# Patient Record
Sex: Male | Born: 1985 | Race: Black or African American | Hispanic: No | Marital: Single | Smoking: Current every day smoker
Health system: Southern US, Community
[De-identification: ages and names within clinical notes are randomized; demographics above are authoritative.]

## PROBLEM LIST (undated history)

## (undated) DIAGNOSIS — K2970 Gastritis, unspecified, without bleeding: Secondary | ICD-10-CM

---
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
## 2004-12-02 ENCOUNTER — Emergency Department (HOSPITAL_COMMUNITY): Admission: EM | Admit: 2004-12-02 | Discharge: 2004-12-03 | Payer: Self-pay | Admitting: Emergency Medicine

## 2005-07-06 ENCOUNTER — Emergency Department (HOSPITAL_COMMUNITY): Admission: EM | Admit: 2005-07-06 | Discharge: 2005-07-06 | Payer: Self-pay | Admitting: Emergency Medicine

## 2005-11-15 ENCOUNTER — Emergency Department (HOSPITAL_COMMUNITY): Admission: EM | Admit: 2005-11-15 | Discharge: 2005-11-15 | Payer: Self-pay | Admitting: Family Medicine

## 2006-04-16 ENCOUNTER — Emergency Department (HOSPITAL_COMMUNITY): Admission: EM | Admit: 2006-04-16 | Discharge: 2006-04-16 | Payer: Self-pay | Admitting: Family Medicine

## 2006-08-31 ENCOUNTER — Emergency Department (HOSPITAL_COMMUNITY): Admission: EM | Admit: 2006-08-31 | Discharge: 2006-09-01 | Payer: Self-pay | Admitting: Emergency Medicine

## 2006-09-02 ENCOUNTER — Emergency Department (HOSPITAL_COMMUNITY): Admission: EM | Admit: 2006-09-02 | Discharge: 2006-09-02 | Payer: Self-pay | Admitting: Family Medicine

## 2008-03-20 ENCOUNTER — Emergency Department (HOSPITAL_COMMUNITY): Admission: EM | Admit: 2008-03-20 | Discharge: 2008-03-20 | Payer: Self-pay | Admitting: Family Medicine

## 2009-09-17 ENCOUNTER — Emergency Department (HOSPITAL_COMMUNITY): Admission: EM | Admit: 2009-09-17 | Discharge: 2009-09-17 | Payer: Self-pay | Admitting: Family Medicine

## 2010-01-19 ENCOUNTER — Emergency Department (HOSPITAL_COMMUNITY): Admission: EM | Admit: 2010-01-19 | Discharge: 2010-01-19 | Payer: Self-pay | Admitting: Emergency Medicine

## 2010-08-21 LAB — COMPREHENSIVE METABOLIC PANEL
Alkaline Phosphatase: 67 U/L (ref 39–117)
CO2: 29 mEq/L (ref 19–32)
Chloride: 100 mEq/L (ref 96–112)
Creatinine, Ser: 1.5 mg/dL (ref 0.4–1.5)
GFR calc Af Amer: >60 mL/min (ref >60)
GFR calc non Af Amer: 57 mL/min — ABNORMAL LOW (ref >60)
Total Bilirubin: 1.1 mg/dL (ref 0.3–1.2)
Total Protein: 7.5 g/dL (ref 6.0–8.3)

## 2010-08-21 LAB — URINALYSIS, ROUTINE W REFLEX MICROSCOPIC
Glucose, UA: NEGATIVE mg/dL
Hgb urine dipstick: NEGATIVE
pH: 7 (ref 5.0–8.0)

## 2010-08-21 LAB — CBC
MCV: 80.6 fL (ref 78.0–100.0)
RBC: 6.23 MIL/uL — ABNORMAL HIGH (ref 4.22–5.81)
RDW: 13 % (ref 11.5–15.5)
WBC: 11.6 10*3/uL — ABNORMAL HIGH (ref 4.0–10.5)

## 2010-08-21 LAB — DIFFERENTIAL
Lymphocytes Relative: 10 % — ABNORMAL LOW (ref 12–46)
Lymphs Abs: 1.2 10*3/uL (ref 0.7–4.0)
Neutro Abs: 9.6 10*3/uL — ABNORMAL HIGH (ref 1.7–7.7)

## 2010-08-21 LAB — URINE MICROSCOPIC-ADD ON

## 2010-10-25 ENCOUNTER — Inpatient Hospital Stay (INDEPENDENT_AMBULATORY_CARE_PROVIDER_SITE_OTHER)
Admission: RE | Admit: 2010-10-25 | Discharge: 2010-10-25 | Disposition: A | Discharge status: Home / Self Care | Payer: Self-pay | Source: Ambulatory Visit | Attending: Family Medicine | Admitting: Family Medicine

## 2010-10-25 DIAGNOSIS — K2990 Gastroduodenitis, unspecified, without bleeding: Secondary | ICD-10-CM

## 2010-12-29 ENCOUNTER — Inpatient Hospital Stay (INDEPENDENT_AMBULATORY_CARE_PROVIDER_SITE_OTHER)
Admission: RE | Admit: 2010-12-29 | Discharge: 2010-12-29 | Disposition: A | Discharge status: Home / Self Care | Payer: Self-pay | Source: Ambulatory Visit | Attending: Family Medicine | Admitting: Family Medicine

## 2010-12-29 ENCOUNTER — Emergency Department (HOSPITAL_COMMUNITY)
Admission: EM | Admit: 2010-12-29 | Discharge: 2010-12-29 | Disposition: A | Discharge status: Home / Self Care | Payer: Self-pay | Attending: Emergency Medicine | Admitting: Emergency Medicine

## 2010-12-29 DIAGNOSIS — R1115 Cyclical vomiting syndrome unrelated to migraine: Secondary | ICD-10-CM | POA: Insufficient documentation

## 2010-12-29 DIAGNOSIS — K2960 Other gastritis without bleeding: Secondary | ICD-10-CM | POA: Insufficient documentation

## 2010-12-29 DIAGNOSIS — K219 Gastro-esophageal reflux disease without esophagitis: Secondary | ICD-10-CM | POA: Insufficient documentation

## 2010-12-29 DIAGNOSIS — R109 Unspecified abdominal pain: Secondary | ICD-10-CM | POA: Insufficient documentation

## 2010-12-29 DIAGNOSIS — F17200 Nicotine dependence, unspecified, uncomplicated: Secondary | ICD-10-CM | POA: Insufficient documentation

## 2010-12-29 DIAGNOSIS — K2970 Gastritis, unspecified, without bleeding: Secondary | ICD-10-CM

## 2010-12-29 DIAGNOSIS — A048 Other specified bacterial intestinal infections: Secondary | ICD-10-CM | POA: Insufficient documentation

## 2010-12-29 LAB — URINALYSIS, ROUTINE W REFLEX MICROSCOPIC
Glucose, UA: NEGATIVE mg/dL
Hgb urine dipstick: NEGATIVE
Ketones, ur: 40 mg/dL — AB
Leukocytes, UA: NEGATIVE
Specific Gravity, Urine: 1.02 (ref 1.005–1.030)

## 2010-12-29 LAB — CBC
HCT: 51.6 % (ref 39.0–52.0)
Hemoglobin: 19 g/dL — ABNORMAL HIGH (ref 13.0–17.0)
MCH: 29.2 pg (ref 26.0–34.0)
MCHC: 36.8 g/dL — ABNORMAL HIGH (ref 30.0–36.0)
MCV: 78.8 fL (ref 78.0–100.0)
Platelets: 372 10*3/uL (ref 150–400)
RBC: 5.85 MIL/uL — ABNORMAL HIGH (ref 4.22–5.81)
RDW: 13 % (ref 11.5–15.5)

## 2010-12-29 LAB — POCT H PYLORI SCREEN: H. PYLORI SCREEN, POC: POSITIVE — AB

## 2010-12-29 LAB — DIFFERENTIAL
Lymphs Abs: 1.7 10*3/uL (ref 0.7–4.0)
Monocytes Absolute: 1.5 10*3/uL — ABNORMAL HIGH (ref 0.1–1.0)
Monocytes Absolute: 1.3 10*3/uL — ABNORMAL HIGH (ref 0.1–1.0)
Monocytes Relative: 7 % (ref 3–12)
Neutro Abs: 19.3 10*3/uL — ABNORMAL HIGH (ref 1.7–7.7)
Neutro Abs: 14.9 10*3/uL — ABNORMAL HIGH (ref 1.7–7.7)
Neutrophils Relative %: 85 % — ABNORMAL HIGH (ref 43–77)
Neutrophils Relative %: 83 % — ABNORMAL HIGH (ref 43–77)

## 2010-12-29 LAB — OCCULT BLOOD, POC DEVICE: Fecal Occult Bld: NEGATIVE

## 2010-12-29 LAB — BASIC METABOLIC PANEL
CO2: 27 mEq/L (ref 19–32)
Calcium: 12.8 mg/dL — ABNORMAL HIGH (ref 8.4–10.5)
Calcium: 9.8 mg/dL (ref 8.4–10.5)
GFR calc Af Amer: >60 mL/min (ref >60)
GFR calc Af Amer: >60 mL/min (ref >60)
Glucose, Bld: 102 mg/dL — ABNORMAL HIGH (ref 70–99)
Potassium: 3.4 mEq/L — ABNORMAL LOW (ref 3.5–5.1)
Potassium: 3.4 mEq/L — ABNORMAL LOW (ref 3.5–5.1)
Sodium: 136 mEq/L (ref 135–145)

## 2011-04-02 ENCOUNTER — Inpatient Hospital Stay (INDEPENDENT_AMBULATORY_CARE_PROVIDER_SITE_OTHER)
Admission: RE | Admit: 2011-04-02 | Discharge: 2011-04-02 | Disposition: A | Discharge status: Home / Self Care | Payer: Self-pay | Source: Ambulatory Visit | Attending: Family Medicine | Admitting: Family Medicine

## 2011-04-02 DIAGNOSIS — K219 Gastro-esophageal reflux disease without esophagitis: Secondary | ICD-10-CM

## 2011-04-02 DIAGNOSIS — K2990 Gastroduodenitis, unspecified, without bleeding: Secondary | ICD-10-CM

## 2012-03-24 ENCOUNTER — Encounter (HOSPITAL_COMMUNITY): Payer: Self-pay | Admitting: Emergency Medicine

## 2012-03-24 ENCOUNTER — Emergency Department (INDEPENDENT_AMBULATORY_CARE_PROVIDER_SITE_OTHER)
Admission: EM | Admit: 2012-03-24 | Discharge: 2012-03-24 | Disposition: A | Discharge status: Home / Self Care | Payer: Self-pay | Source: Home / Self Care

## 2012-03-24 DIAGNOSIS — K047 Periapical abscess without sinus: Secondary | ICD-10-CM

## 2012-03-24 MED ORDER — HYDROCODONE-ACETAMINOPHEN 5-500 MG PO TABS: 1.0000 | ORAL_TABLET | Freq: Four times a day (QID) | ORAL | Status: DC | PRN

## 2012-03-24 MED ORDER — AMOXICILLIN 500 MG PO CAPS: 500.0000 mg | ORAL_CAPSULE | Freq: Three times a day (TID) | ORAL | Status: DC

## 2012-03-24 NOTE — ED Provider Notes (Signed)
History     CSN: [id number]  Arrival date & time 03/24/12  1105   First MD Initiated Contact with Patient 03/24/12 1211      Chief Complaint  Patient presents with  . Dental Pain    (Consider location/radiation/quality/duration/timing/severity/associated sxs/prior treatment) HPI Pain in upper right (last) molar 4 days. peircing in quality, constant pain. Attempted pain control with 800 mg of Motrin without relief. Not able to sleep due to pain.  Also c/o ongoing upper abdominal pain- worse after the Motrin.   History reviewed. - Gastritis.  History reviewed. No pertinent past surgical history.   family history Mother- DM, HTN and anxiety.   History  Substance Use Topics  . Smoking status: Current Every Day Smoker -- 1.0 packs/day    Types: Cigarettes  . Smokeless tobacco: Not on file  . Alcohol Use: Yes- 1 beer a day      Review of Systems  All other systems reviewed and are negative.   All ROS reviewed - negative.   Allergies  Review of patient's allergies indicates no known allergies.  Home Medications   Current Outpatient Rx  Name Route Sig Dispense Refill  . AMOXICILLIN 500 MG PO CAPS Oral Take 1 capsule (500 mg total) by mouth 3 (three) times daily. 21 capsule 0  . HYDROCODONE-ACETAMINOPHEN 5-500 MG PO TABS Oral Take 1-2 tablets by mouth every 6 (six) hours as needed for pain. 30 tablet 0    BP 145/104  Pulse 72  Temp 97.3 F (36.3 C) (Oral)  Resp 18  SpO2 98%  Physical Exam  Constitutional: He is oriented to person, place, and time. He appears well-developed and well-nourished.  HENT:  Mouth/Throat: Oropharynx is clear and moist.        last molar on the right in upper row-Severely chipped at the base of the tooth, outer aspect- with surrounding tenderness.    Eyes: EOM are normal. Pupils are equal, round, and reactive to light.  Neck: Normal range of motion.  Cardiovascular: Normal rate and regular rhythm.  Exam reveals no gallop.   No  murmur heard. Pulmonary/Chest: Effort normal and breath sounds normal.  Abdominal: Soft. Bowel sounds are normal.  Musculoskeletal: Normal range of motion.  Neurological: He is alert and oriented to person, place, and time.  Skin: Skin is warm and dry.  Psychiatric: He has a normal mood and affect.    ED Course  Procedures (including critical care time)  Labs Reviewed - No data to display No results found.   1. Abscess, dental       MDM   Chipped Molar, likely with abscess- Will order Amoxil, Vicodin- f/u with dentist ASAP.         Calvert Cantor, MD 03/24/12 1249

## 2012-03-24 NOTE — ED Notes (Signed)
Pt c/o tooth pain x4 days on right side... Sx include: pain, nausea, stomach ache, abscess... Denies: fevers, vomiting, diarrhea, headaches... Pt is alert w/no signs of distress and sitting up straight.

## 2012-06-26 ENCOUNTER — Emergency Department (HOSPITAL_COMMUNITY)
Admission: EM | Admit: 2012-06-26 | Discharge: 2012-06-26 | Disposition: A | Discharge status: Home / Self Care | Payer: Self-pay | Attending: Emergency Medicine | Admitting: Emergency Medicine

## 2012-06-26 ENCOUNTER — Encounter (HOSPITAL_COMMUNITY): Payer: Self-pay | Admitting: Emergency Medicine

## 2012-06-26 DIAGNOSIS — K2990 Gastroduodenitis, unspecified, without bleeding: Secondary | ICD-10-CM | POA: Insufficient documentation

## 2012-06-26 DIAGNOSIS — K2970 Gastritis, unspecified, without bleeding: Secondary | ICD-10-CM | POA: Insufficient documentation

## 2012-06-26 DIAGNOSIS — R079 Chest pain, unspecified: Secondary | ICD-10-CM | POA: Insufficient documentation

## 2012-06-26 DIAGNOSIS — R1013 Epigastric pain: Secondary | ICD-10-CM | POA: Insufficient documentation

## 2012-06-26 DIAGNOSIS — F17200 Nicotine dependence, unspecified, uncomplicated: Secondary | ICD-10-CM | POA: Insufficient documentation

## 2012-06-26 LAB — CBC WITH DIFFERENTIAL/PLATELET
Basophils Absolute: 0 10*3/uL (ref 0.0–0.1)
Basophils Relative: 0 % (ref 0–1)
Eosinophils Absolute: 0 10*3/uL (ref 0.0–0.7)
HCT: 49 % (ref 39.0–52.0)
Hemoglobin: 17.3 g/dL — ABNORMAL HIGH (ref 13.0–17.0)
MCH: 28.5 pg (ref 26.0–34.0)
MCHC: 35.3 g/dL (ref 30.0–36.0)
MCV: 80.6 fL (ref 78.0–100.0)
Neutrophils Relative %: 83 % — ABNORMAL HIGH (ref 43–77)
RBC: 6.08 MIL/uL — ABNORMAL HIGH (ref 4.22–5.81)
RDW: 13.3 % (ref 11.5–15.5)

## 2012-06-26 LAB — LIPASE, BLOOD: Lipase: 25 U/L (ref 11–59)

## 2012-06-26 LAB — COMPREHENSIVE METABOLIC PANEL
Albumin: 4.5 g/dL (ref 3.5–5.2)
Alkaline Phosphatase: 58 U/L (ref 39–117)
CO2: 30 mEq/L (ref 19–32)
Calcium: 10.4 mg/dL (ref 8.4–10.5)
Creatinine, Ser: 1.31 mg/dL (ref 0.50–1.35)
GFR calc non Af Amer: 73 mL/min — ABNORMAL LOW (ref >90)
Total Protein: 7.6 g/dL (ref 6.0–8.3)

## 2012-06-26 MED ORDER — PANTOPRAZOLE SODIUM 40 MG PO TBEC: 40.0000 mg | DELAYED_RELEASE_TABLET | Freq: Once | ORAL | Status: DC

## 2012-06-26 MED ORDER — ONDANSETRON 4 MG PO TBDP
ORAL_TABLET | ORAL | Status: AC
  Filled 2012-06-26: qty 2

## 2012-06-26 MED ORDER — PROMETHAZINE HCL 25 MG PO TABS: 25.0000 mg | ORAL_TABLET | Freq: Four times a day (QID) | ORAL | Status: DC | PRN

## 2012-06-26 MED ORDER — OMEPRAZOLE 20 MG PO CPDR: 40.0000 mg | DELAYED_RELEASE_CAPSULE | Freq: Every day | ORAL | Status: DC

## 2012-06-26 MED ORDER — ONDANSETRON 4 MG PO TBDP
8.0000 mg | ORAL_TABLET | Freq: Once | ORAL | Status: AC
  Administered 2012-06-26: 8 mg via ORAL

## 2012-06-26 MED ORDER — GI COCKTAIL ~~LOC~~
30.0000 mL | Freq: Once | ORAL | Status: AC
  Administered 2012-06-26: 30 mL via ORAL
  Filled 2012-06-26: qty 30

## 2012-06-26 NOTE — ED Notes (Signed)
Pt reports daily ETOH of beer. Last drink was Wednesday

## 2012-06-26 NOTE — ED Notes (Signed)
Pt c/o n/v since Friday. Last vomiting 0100. Pt reports hx of gastritis and states "this is probably from not doing what I am suppose to". Denies abd pain

## 2012-06-26 NOTE — ED Notes (Signed)
MD at bedside. 

## 2012-06-26 NOTE — ED Provider Notes (Signed)
History     CSN: [id number]  Arrival date & time 06/26/12  2130   First MD Initiated Contact with Patient 06/26/12 [phone]      Chief Complaint  Patient presents with  . Nausea  . Emesis    (Consider location/radiation/quality/duration/timing/severity/associated sxs/prior treatment) HPI Comments: Patient complains of nausea and vomiting associated epigastric pain for the last 3 days. He states it started after he was drinking alcohol. He's had a history of gastritis in the past that felt similar to this. He had an episode 2 days ago her throughout the little bit of blood but hasn't had any hematemesis since then. He denies any dizziness. He states his epigastric pain is better but he still feels nauseated. He states the discomfort radiates into his mid chest area. He denies any shortness of breath. Denies any pleuritic pain. He denies any blood in his stools or diarrhea. He had a history of gastritis several times in the past, the most recent episode was this past September. He has not seen a gastroenterologist.  Patient is a 27 y.o. male presenting with vomiting.  Emesis  Associated symptoms include abdominal pain. Pertinent negatives include no arthralgias, no chills, no cough, no diarrhea, no fever and no headaches.    History reviewed. No pertinent past medical history.  History reviewed. No pertinent past surgical history.  No family history on file.  History  Substance Use Topics  . Smoking status: Current Every Day Smoker -- 1.0 packs/day    Types: Cigarettes  . Smokeless tobacco: Not on file  . Alcohol Use: Yes     Comment: Daily      Review of Systems  Constitutional: Negative for fever, chills, diaphoresis and fatigue.  HENT: Negative for congestion, rhinorrhea and sneezing.   Eyes: Negative.   Respiratory: Negative for cough, chest tightness and shortness of breath.   Cardiovascular: Positive for chest pain. Negative for leg swelling.  Gastrointestinal: Positive  for nausea, vomiting and abdominal pain. Negative for diarrhea and blood in stool.  Genitourinary: Negative for frequency, hematuria, flank pain and difficulty urinating.  Musculoskeletal: Negative for back pain and arthralgias.  Skin: Negative for rash.  Neurological: Negative for dizziness, speech difficulty, weakness, numbness and headaches.    Allergies  Review of patient's allergies indicates no known allergies.  Home Medications   Current Outpatient Rx  Name  Route  Sig  Dispense  Refill  . OMEPRAZOLE 20 MG PO CPDR   Oral   Take 2 capsules (40 mg total) by mouth daily.   60 capsule   0   . PROMETHAZINE HCL 25 MG PO TABS   Oral   Take 1 tablet (25 mg total) by mouth every 6 (six) hours as needed for nausea.   30 tablet   0     BP 142/95  Pulse 92  Temp 97.7 F (36.5 C) (Oral)  Resp 18  Ht 5\' 10"  (1.778 m)  Wt 190 lb (86.183 kg)  BMI 27.26 kg/m2  SpO2 95%  Physical Exam  Constitutional: He is oriented to person, place, and time. He appears well-developed and well-nourished.  HENT:  Head: Normocephalic and atraumatic.  Eyes: Pupils are equal, round, and reactive to light.  Neck: Normal range of motion. Neck supple.  Cardiovascular: Normal rate, regular rhythm and normal heart sounds.   Pulmonary/Chest: Effort normal and breath sounds normal. No respiratory distress. He has no wheezes. He has no rales. He exhibits no tenderness.  Abdominal: Soft. Bowel sounds are normal. There  is tenderness (mild tenderness to the epigastric region). There is no rebound and no guarding.  Musculoskeletal: Normal range of motion. He exhibits no edema.  Lymphadenopathy:    He has no cervical adenopathy.  Neurological: He is alert and oriented to person, place, and time.  Skin: Skin is warm and dry. No rash noted.  Psychiatric: He has a normal mood and affect.    ED Course  Procedures (including critical care time)  Results for orders placed during the hospital encounter of  06/26/12  COMPREHENSIVE METABOLIC PANEL      Component Value Range   Sodium 139  135 - 145 mEq/L   Potassium 3.7  3.5 - 5.1 mEq/L   Chloride 96  96 - 112 mEq/L   CO2 30  19 - 32 mEq/L   Glucose, Bld 118 (*) 70 - 99 mg/dL   BUN 13  6 - 23 mg/dL   Creatinine, Ser 4.54  0.50 - 1.35 mg/dL   Calcium 09.8  8.4 - 11.9 mg/dL   Total Protein 7.6  6.0 - 8.3 g/dL   Albumin 4.5  3.5 - 5.2 g/dL   AST 17  0 - 37 U/L   ALT 19  0 - 53 U/L   Alkaline Phosphatase 58  39 - 117 U/L   Total Bilirubin 0.9  0.3 - 1.2 mg/dL   GFR calc non Af Amer 73 (*) >90 mL/min   GFR calc Af Amer 85 (*) >90 mL/min  CBC WITH DIFFERENTIAL      Component Value Range   WBC 14.6 (*) 4.0 - 10.5 K/uL   RBC 6.08 (*) 4.22 - 5.81 MIL/uL   Hemoglobin 17.3 (*) 13.0 - 17.0 g/dL   HCT 14.7  82.9 - 56.2 %   MCV 80.6  78.0 - 100.0 fL   MCH 28.5  26.0 - 34.0 pg   MCHC 35.3  30.0 - 36.0 g/dL   RDW 13.0  86.5 - 78.4 %   Platelets 367  150 - 400 K/uL   Neutrophils Relative 83 (*) 43 - 77 %   Neutro Abs 12.1 (*) 1.7 - 7.7 K/uL   Lymphocytes Relative 10 (*) 12 - 46 %   Lymphs Abs 1.4  0.7 - 4.0 K/uL   Monocytes Relative 7  3 - 12 %   Monocytes Absolute 1.1 (*) 0.1 - 1.0 K/uL   Eosinophils Relative 0  0 - 5 %   Eosinophils Absolute 0.0  0.0 - 0.7 K/uL   Basophils Relative 0  0 - 1 %   Basophils Absolute 0.0  0.0 - 0.1 K/uL  LIPASE, BLOOD      Component Value Range   Lipase 25  11 - 59 U/L  POCT I-STAT TROPONIN I      Component Value Range   Troponin i, poc 0.00  0.00 - 0.08 ng/mL   Comment 3            No results found.   No results found.  Date: 06/26/2012  Rate: 72  Rhythm: normal sinus rhythm  QRS Axis: normal  Intervals: normal  ST/T Wave abnormalities: t wave inversion inferiorly/laterally  Conduction Disutrbances:none  Narrative Interpretation:   Old EKG Reviewed: changes noted, new t wave inversion    1. Gastritis       MDM  Patient is given a GI cocktail and some Zofran. He's feeling better after  that. He still has a little bit of nausea but has had no vomiting. Has no complaints  of abdominal pain. There's nothing to suggest a perforation or dissection. He did have some subtle EKG changes but his symptoms are not suggestive of acute coronary syndrome and his troponin is negative even given the symptoms have been gone on for 3-4 days. I will give him outpatient referral to follow up with gastroenterology. I did give him prescription for Prilosec and Phenergan. I advised to return here if his symptoms worsen.        Rolan Bucco, MD 06/26/12 1024

## 2012-09-30 ENCOUNTER — Emergency Department (HOSPITAL_COMMUNITY)
Admission: EM | Admit: 2012-09-30 | Discharge: 2012-09-30 | Disposition: A | Discharge status: Home / Self Care | Payer: Self-pay | Attending: Emergency Medicine | Admitting: Emergency Medicine

## 2012-09-30 ENCOUNTER — Encounter (HOSPITAL_COMMUNITY): Payer: Self-pay | Admitting: Physical Medicine and Rehabilitation

## 2012-09-30 DIAGNOSIS — Y9289 Other specified places as the place of occurrence of the external cause: Secondary | ICD-10-CM | POA: Insufficient documentation

## 2012-09-30 DIAGNOSIS — F17200 Nicotine dependence, unspecified, uncomplicated: Secondary | ICD-10-CM | POA: Insufficient documentation

## 2012-09-30 DIAGNOSIS — S0181XA Laceration without foreign body of other part of head, initial encounter: Secondary | ICD-10-CM

## 2012-09-30 DIAGNOSIS — IMO0002 Reserved for concepts with insufficient information to code with codable children: Secondary | ICD-10-CM | POA: Insufficient documentation

## 2012-09-30 DIAGNOSIS — S0180XA Unspecified open wound of other part of head, initial encounter: Secondary | ICD-10-CM | POA: Insufficient documentation

## 2012-09-30 DIAGNOSIS — Z791 Long term (current) use of non-steroidal anti-inflammatories (NSAID): Secondary | ICD-10-CM | POA: Insufficient documentation

## 2012-09-30 DIAGNOSIS — Y9389 Activity, other specified: Secondary | ICD-10-CM | POA: Insufficient documentation

## 2012-09-30 DIAGNOSIS — Z79899 Other long term (current) drug therapy: Secondary | ICD-10-CM | POA: Insufficient documentation

## 2012-09-30 MED ORDER — LIDOCAINE-EPINEPHRINE 1 %-1:100000 IJ SOLN
10.0000 mL | Freq: Once | INTRAMUSCULAR | Status: DC
  Filled 2012-09-30: qty 1

## 2012-09-30 MED ORDER — NAPROXEN 500 MG PO TABS: 500.0000 mg | ORAL_TABLET | Freq: Two times a day (BID) | ORAL | Status: DC

## 2012-09-30 MED ORDER — TETANUS-DIPHTH-ACELL PERTUSSIS 5-2.5-18.5 LF-MCG/0.5 IM SUSP
0.5000 mL | Freq: Once | INTRAMUSCULAR | Status: AC
  Administered 2012-09-30: 0.5 mL via INTRAMUSCULAR
  Filled 2012-09-30: qty 0.5
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
## 2012-10-01 ENCOUNTER — Emergency Department (HOSPITAL_COMMUNITY)
Admission: EM | Admit: 2012-10-01 | Discharge: 2012-10-01 | Disposition: A | Discharge status: Home / Self Care | Payer: Self-pay | Attending: Emergency Medicine | Admitting: Emergency Medicine

## 2012-10-01 ENCOUNTER — Encounter (HOSPITAL_COMMUNITY): Payer: Self-pay

## 2012-10-01 DIAGNOSIS — R58 Hemorrhage, not elsewhere classified: Secondary | ICD-10-CM

## 2012-10-01 DIAGNOSIS — F17200 Nicotine dependence, unspecified, uncomplicated: Secondary | ICD-10-CM | POA: Insufficient documentation

## 2012-10-01 DIAGNOSIS — Z8719 Personal history of other diseases of the digestive system: Secondary | ICD-10-CM | POA: Insufficient documentation

## 2012-10-01 DIAGNOSIS — Z5189 Encounter for other specified aftercare: Secondary | ICD-10-CM

## 2012-10-01 DIAGNOSIS — Z4801 Encounter for change or removal of surgical wound dressing: Secondary | ICD-10-CM | POA: Insufficient documentation

## 2012-10-01 HISTORY — DX: Gastritis, unspecified, without bleeding: K29.70

## 2012-10-01 NOTE — ED Provider Notes (Signed)
History     CSN: [id number]  Arrival date & time 10/01/12  4098   First MD Initiated Contact with Patient 10/01/12 [phone]      Chief Complaint  Patient presents with  . Wound Check    (Consider location/radiation/quality/duration/timing/severity/associated sxs/prior treatment) HPI  27 year old male presents to the emergency department for wound check.  He was seen here yesterday for a laceration above his left eyebrow.  6 simple interrupted loosely stitched Prolene stitches were placed and are intact.  The patient states that he awoke in the middle of the night with bleeding from the wound site.  He was unable to control the bleeding and sought medical attention.  The patient denies any history of bleeding problems.  He does not take aspirin and has not been taking any NSAIDs. He denies any severe pain at the site, any purulent discharge.  Denies fevers, chills, myalgias, arthralgias. Denies DOE, SOB, chest tightness or pressure, radiation to left arm, jaw or back, or diaphoresis. Denies dysuria, flank pain, suprapubic pain, frequency, urgency, or hematuria. Denies headaches, light headedness, weakness, visual disturbances. Denies abdominal pain, nausea, vomiting, diarrhea or constipation.    Past Medical History  Diagnosis Date  . Gastritis     History reviewed. No pertinent past surgical history.  No family history on file.  History  Substance Use Topics  . Smoking status: Current Every Day Smoker -- 1.00 packs/day    Types: Cigarettes  . Smokeless tobacco: Not on file  . Alcohol Use: Yes     Comment: Daily      Review of Systems As stated in history of present illness. Allergies  Review of patient's allergies indicates no known allergies.  Home Medications  No current outpatient prescriptions on file.  BP 132/90  Pulse 87  Temp(Src) 98.2 F (36.8 C) (Oral)  Resp 16  SpO2 98%  Physical Exam Physical Exam  Nursing note and vitals reviewed. Constitutional:  He appears well-developed and well-nourished. No distress.  HENT:  Head: 3 cm laceration above the left eyebrow.  6 simple interrupted Prolene stitches are in place.  Actively bleeding from the lateral portion. Eyes: Conjunctivae normal are normal. No scleral icterus.  Neck: Normal range of motion. Neck supple.  Cardiovascular: Normal rate, regular rhythm and normal heart sounds.   Pulmonary/Chest: Effort normal and breath sounds normal. No respiratory distress.  Abdominal: Soft. There is no tenderness.  Musculoskeletal: He exhibits no edema.  Neurological: He is alert.  Skin: Skin is warm and dry. He is not diaphoretic.  Psychiatric: His behavior is normal.    ED Course  Procedures (including critical care time)  Labs Reviewed - No data to display No results found.   1. Encounter for post-traumatic wound check   2. Bleeding       MDM  10:10 AM Patient with bleeding from a laceration which was repaired here yesterday.  I have placed Surgicel on the wound with a pressure dressing.  We'll reevaluate shortly for interruption of bleeding.   10:33 AM Bleeding has resolved. Patient will go home with surgeicel and wound care instructions. Return forsigns of infection or uncontrolled bleeding. The patient appears reasonably screened and/or stabilized for discharge and I doubt any other medical condition or other [facility] requiring further screening, evaluation, or treatment in the ED at this time prior to discharge.      Jodie Leiner, PA-C 10/01/12 1034

## 2012-10-01 NOTE — ED Provider Notes (Signed)
Medical screening examination/treatment/procedure(s) were performed by non-physician practitioner and as supervising physician I was immediately available for consultation/collaboration.  Juliet Rude. Rubin Payor, MD 10/01/12 1521

## 2012-10-01 NOTE — ED Notes (Signed)
Pt had stitches placed above left eye at this ED yesterday.  Pt woke up this morning and he is bleeding from the wound.  Pt is concerned that stitches opened up.  Pt has towel on head.  There appears to be blood on the dressing over the suture and on the towel.

## 2012-10-02 ENCOUNTER — Telehealth (HOSPITAL_COMMUNITY): Payer: Self-pay | Admitting: Emergency Medicine

## 2012-10-05 ENCOUNTER — Emergency Department (HOSPITAL_COMMUNITY)
Admission: EM | Admit: 2012-10-05 | Discharge: 2012-10-05 | Disposition: A | Discharge status: Home / Self Care | Payer: Self-pay | Attending: Emergency Medicine | Admitting: Emergency Medicine

## 2012-10-05 ENCOUNTER — Encounter (HOSPITAL_COMMUNITY): Payer: Self-pay | Admitting: Emergency Medicine

## 2012-10-05 DIAGNOSIS — Z4802 Encounter for removal of sutures: Secondary | ICD-10-CM | POA: Insufficient documentation

## 2012-10-05 DIAGNOSIS — Z8719 Personal history of other diseases of the digestive system: Secondary | ICD-10-CM | POA: Insufficient documentation

## 2012-10-05 DIAGNOSIS — F17200 Nicotine dependence, unspecified, uncomplicated: Secondary | ICD-10-CM | POA: Insufficient documentation

## 2012-10-05 NOTE — ED Notes (Signed)
Pt reports here for suture removal. Pt had sutures placed left eyebrow area on Saturday. Pt denies any problems with wound area.

## 2012-10-05 NOTE — ED Provider Notes (Signed)
History     CSN: [id number]  Arrival date & time 10/05/12  0909   First MD Initiated Contact with Patient 10/05/12 [phone]      Chief Complaint  Patient presents with  . Suture / Staple Removal    (Consider location/radiation/quality/duration/timing/severity/associated sxs/prior treatment) HPI Comments: 27 year old male presents emergency department for suture removal after having 6 sutures placed along his left eyebrow 6 days ago. He was seen in the emergency department on Sunday after he pulled the bandage off causing the sutures to bleed, however there was no problem. Since then he has not had any issues. Denies any redness or swelling. No pain.  Patient is a 27 y.o. male presenting with suture removal. The history is provided by the patient.  Suture / Staple Removal     Past Medical History  Diagnosis Date  . Gastritis     History reviewed. No pertinent past surgical history.  No family history on file.  History  Substance Use Topics  . Smoking status: Current Every Day Smoker -- 1.00 packs/day    Types: Cigarettes  . Smokeless tobacco: Not on file  . Alcohol Use: Yes     Comment: Daily      Review of Systems  Skin: Positive for wound. Negative for color change.  All other systems reviewed and are negative.    Allergies  Review of patient's allergies indicates no known allergies.  Home Medications  No current outpatient prescriptions on file.  BP 134/89  Pulse 83  Temp(Src) 97.2 F (36.2 C) (Oral)  Resp 18  SpO2 99%  Physical Exam  Nursing note and vitals reviewed. Constitutional: He is oriented to person, place, and time. He appears well-developed and well-nourished. No distress.  HENT:  Head: Normocephalic and atraumatic.  Eyes: Conjunctivae and EOM are normal.  Neck: Normal range of motion. Neck supple.  Cardiovascular: Normal rate, regular rhythm and normal heart sounds.   Pulmonary/Chest: Effort normal and breath sounds normal.   Musculoskeletal: Normal range of motion. He exhibits no edema.  Neurological: He is alert and oriented to person, place, and time.  Skin: Skin is warm and dry.  1 inch well healed laceration along left eyebrow. 6 sutures intact. No surrounding erythema or edema. No discharge.  Psychiatric: He has a normal mood and affect. His behavior is normal.    ED Course  Procedures (including critical care time) SUTURE REMOVAL Performed by: Johnnette Gourd  Consent: Verbal consent obtained. Patient identity confirmed: provided demographic data Time out: Immediately prior to procedure a "time out" was called to verify the correct patient, procedure, equipment, support staff and site/side marked as required.  Location details: left eyebrow  Wound Appearance: clean  Sutures/Staples Removed: 6  Facility: sutures placed in this facility Patient tolerance: Patient tolerated the procedure well with no immediate complications.    Labs Reviewed - No data to display No results found.   1. Visit for suture removal       MDM  Patient here for suture removal. 6 sutures removed. Wound is healing very well. Wound care given.       Trevor Mace, PA-C 10/05/12 [phone]

## 2012-10-05 NOTE — ED Provider Notes (Signed)
Medical screening examination/treatment/procedure(s) were performed by non-physician practitioner and as supervising physician I was immediately available for consultation/collaboration.  Flint Melter, MD 10/05/12 [phone]

## 2012-11-09 ENCOUNTER — Encounter (HOSPITAL_COMMUNITY): Payer: Self-pay | Admitting: Emergency Medicine

## 2012-11-09 ENCOUNTER — Emergency Department (HOSPITAL_COMMUNITY)
Admission: EM | Admit: 2012-11-09 | Discharge: 2012-11-09 | Disposition: A | Discharge status: Home / Self Care | Payer: Self-pay | Attending: Emergency Medicine | Admitting: Emergency Medicine

## 2012-11-09 DIAGNOSIS — F17200 Nicotine dependence, unspecified, uncomplicated: Secondary | ICD-10-CM | POA: Insufficient documentation

## 2012-11-09 DIAGNOSIS — K2970 Gastritis, unspecified, without bleeding: Secondary | ICD-10-CM | POA: Insufficient documentation

## 2012-11-09 DIAGNOSIS — Z79899 Other long term (current) drug therapy: Secondary | ICD-10-CM | POA: Insufficient documentation

## 2012-11-09 LAB — COMPREHENSIVE METABOLIC PANEL
ALT: 21 U/L (ref 0–53)
BUN: 19 mg/dL (ref 6–23)
CO2: 26 mEq/L (ref 19–32)
Calcium: 10.5 mg/dL (ref 8.4–10.5)
Creatinine, Ser: 1.33 mg/dL (ref 0.50–1.35)
GFR calc Af Amer: 83 mL/min — ABNORMAL LOW (ref >90)
GFR calc non Af Amer: 72 mL/min — ABNORMAL LOW (ref >90)
Glucose, Bld: 109 mg/dL — ABNORMAL HIGH (ref 70–99)
Total Protein: 8.4 g/dL — ABNORMAL HIGH (ref 6.0–8.3)

## 2012-11-09 LAB — CBC WITH DIFFERENTIAL/PLATELET
Eosinophils Absolute: 0 10*3/uL (ref 0.0–0.7)
Eosinophils Relative: 0 % (ref 0–5)
HCT: 50.3 % (ref 39.0–52.0)
Lymphocytes Relative: 10 % — ABNORMAL LOW (ref 12–46)
Lymphs Abs: 1.7 10*3/uL (ref 0.7–4.0)
MCH: 28.8 pg (ref 26.0–34.0)
MCV: 79.6 fL (ref 78.0–100.0)
Monocytes Absolute: 1.1 10*3/uL — ABNORMAL HIGH (ref 0.1–1.0)
Monocytes Relative: 7 % (ref 3–12)
Platelets: 398 10*3/uL (ref 150–400)
RBC: 6.32 MIL/uL — ABNORMAL HIGH (ref 4.22–5.81)
WBC: 17.4 10*3/uL — ABNORMAL HIGH (ref 4.0–10.5)

## 2012-11-09 LAB — LIPASE, BLOOD: Lipase: 27 U/L (ref 11–59)

## 2012-11-09 MED ORDER — GI COCKTAIL ~~LOC~~
30.0000 mL | Freq: Once | ORAL | Status: AC
  Administered 2012-11-09: 30 mL via ORAL
  Filled 2012-11-09: qty 30

## 2012-11-09 MED ORDER — ONDANSETRON 4 MG PO TBDP
8.0000 mg | ORAL_TABLET | Freq: Once | ORAL | Status: AC
  Administered 2012-11-09: 8 mg via ORAL
  Filled 2012-11-09 (×2): qty 1

## 2012-11-09 MED ORDER — FAMOTIDINE 20 MG PO TABS: 20.0000 mg | ORAL_TABLET | Freq: Two times a day (BID) | ORAL | Status: DC

## 2012-11-09 MED ORDER — OMEPRAZOLE 20 MG PO CPDR: 20.0000 mg | DELAYED_RELEASE_CAPSULE | Freq: Every day | ORAL | Status: DC

## 2012-11-09 NOTE — ED Notes (Signed)
Wait time advised 

## 2012-11-09 NOTE — ED Provider Notes (Signed)
History     CSN: [id number]  Arrival date & time 11/09/12  1821   First MD Initiated Contact with Patient 11/09/12 2105      Chief Complaint  Patient presents with  . Nausea    (Consider location/radiation/quality/duration/timing/severity/associated sxs/prior treatment) HPI Michael Adams is a 27 y.o. 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  Patient reports that 3 or 4 days ago he consumed alcoholic beverages and developed nausea vomiting and diarrhea next day.  Emesis and diarrhea have since resolved, however patient states that he continues to be nauseous associated epigastric abdominal pain and anorexia.  Patient denies any alcohol use over the last couple of days, melena, hematochezia, hematemesis, fevers, night sweats, chills.  No other at this time.  Past Medical History  Diagnosis Date  . Gastritis     History reviewed. No pertinent past surgical history.  History reviewed. No pertinent family history.  History  Substance Use Topics  . Smoking status: Current Every Day Smoker -- 1.00 packs/day    Types: Cigarettes  . Smokeless tobacco: Not on file  . Alcohol Use: Yes     Comment: Daily      Review of Systems Ten systems reviewed and are negative for acute change, except as noted in the HPI.    Allergies  Review of patient's allergies indicates no known allergies.  Home Medications   Current Outpatient Rx  Name  Route  Sig  Dispense  Refill  . DimenhyDRINATE (DRAMAMINE PO)   Oral   Take 1 tablet by mouth daily as needed (for nausea).         Marland Kitchen RANITIDINE HCL PO   Oral   Take 1 tablet by mouth 2 (two) times daily as needed (heartburn).           BP 151/92  Pulse 68  Temp(Src) 98.5 F (36.9 C) (Oral)  Resp 16  SpO2 99%  Physical Exam  Nursing note and vitals reviewed. Constitutional: Vital signs are normal. He appears  well-developed and well-nourished. No distress.  Nontoxic or septic appearing  HENT:  Head: Normocephalic and atraumatic.  Mouth/Throat: Uvula is midline, oropharynx is clear and moist and mucous membranes are normal.  Moist mucous membranes  Eyes: Conjunctivae and EOM are normal. Pupils are equal, round, and reactive to light.  Neck: Normal range of motion and full passive range of motion without pain. Neck supple. No spinous process tenderness and no muscular tenderness present. No rigidity. No Brudzinski's sign noted.  Cardiovascular: Normal rate and regular rhythm.   Pulmonary/Chest: Effort normal and breath sounds normal. No accessory muscle usage. Not tachypneic. No respiratory distress.  Abdominal: Soft. Normal appearance. He exhibits no distension, no ascites, no pulsatile midline mass and no mass. There is tenderness. There is no CVA tenderness. No hernia.  Epigastric tenderness to palpation.  No peritoneal signs.  Lymphadenopathy:    He has no cervical adenopathy.  Neurological: He is alert.  Skin: Skin is warm and dry. No rash noted. He is not diaphoretic.  Psychiatric: He has a normal mood and affect. His speech is normal and behavior is normal.    ED Course  Procedures (including critical care time)  Labs Reviewed  CBC WITH DIFFERENTIAL - Abnormal; Notable for the following:    WBC 17.4 (*)    RBC 6.32 (*)    Hemoglobin 18.2 (*)    MCHC 36.2 (*)  Neutrophils Relative % 84 (*)    Neutro Abs 14.5 (*)    Lymphocytes Relative 10 (*)    Monocytes Absolute 1.1 (*)    All other components within normal limits  COMPREHENSIVE METABOLIC PANEL - Abnormal; Notable for the following:    Glucose, Bld 109 (*)    Total Protein 8.4 (*)    GFR calc non Af Amer 72 (*)    GFR calc Af Amer 83 (*)    All other components within normal limits  LIPASE, BLOOD   No results found.   1. Gastritis       MDM  27 year old male to emergency department with continuous nausea,  epigastric pain and acid reflux after injuring pain.  Advised against binge drinking.  Patient states that he has taken his ranitidine but not Prilosec because he cannot afford it.  Symptoms improved with GI cocktail and Zofran.  Labs reviewed showing leukocytosis, normal LFTs and normal lipase.  As patient is afebrile with a nonsurgical abdomen on exam further workup is not indicated at this time.  Advised patient to take PPI and H2-blocker as well as refrain from binge drinking.  Followup with primary care doctor  recommended.  Resource guide and information on the affordable care act given a discharge.        Jaci Carrel, [place] 11/09/12 2257

## 2012-11-09 NOTE — ED Notes (Signed)
Pt c/o N/V/D x 3 days; pt sts thinks from gastritis

## 2012-11-11 NOTE — ED Provider Notes (Signed)
Medical screening examination/treatment/procedure(s) were performed by non-physician practitioner and as supervising physician I was immediately available for consultation/collaboration.   Shelda Jakes, MD 11/11/12 [phone]

## 2013-09-02 ENCOUNTER — Encounter (HOSPITAL_COMMUNITY): Payer: Self-pay | Admitting: Emergency Medicine

## 2013-09-02 ENCOUNTER — Emergency Department (HOSPITAL_COMMUNITY)
Admission: EM | Admit: 2013-09-02 | Discharge: 2013-09-02 | Disposition: A | Discharge status: Home / Self Care | Payer: Self-pay | Attending: Emergency Medicine | Admitting: Emergency Medicine

## 2013-09-02 ENCOUNTER — Emergency Department (HOSPITAL_COMMUNITY): Payer: Self-pay

## 2013-09-02 DIAGNOSIS — Z79899 Other long term (current) drug therapy: Secondary | ICD-10-CM | POA: Insufficient documentation

## 2013-09-02 DIAGNOSIS — K2970 Gastritis, unspecified, without bleeding: Secondary | ICD-10-CM | POA: Insufficient documentation

## 2013-09-02 DIAGNOSIS — F17200 Nicotine dependence, unspecified, uncomplicated: Secondary | ICD-10-CM | POA: Insufficient documentation

## 2013-09-02 DIAGNOSIS — K2990 Gastroduodenitis, unspecified, without bleeding: Principal | ICD-10-CM

## 2013-09-02 LAB — CBC WITH DIFFERENTIAL/PLATELET
BASOS ABS: 0 10*3/uL (ref 0.0–0.1)
Basophils Relative: 0 % (ref 0–1)
EOS PCT: 0 % (ref 0–5)
Eosinophils Absolute: 0.1 10*3/uL (ref 0.0–0.7)
HEMATOCRIT: 45.2 % (ref 39.0–52.0)
Hemoglobin: 16.5 g/dL (ref 13.0–17.0)
LYMPHS PCT: 12 % (ref 12–46)
Lymphs Abs: 1.6 10*3/uL (ref 0.7–4.0)
MCH: 29.4 pg (ref 26.0–34.0)
MCHC: 36.5 g/dL — AB (ref 30.0–36.0)
MCV: 80.4 fL (ref 78.0–100.0)
MONO ABS: 0.8 10*3/uL (ref 0.1–1.0)
Monocytes Relative: 6 % (ref 3–12)
Neutro Abs: 10.3 10*3/uL — ABNORMAL HIGH (ref 1.7–7.7)
Neutrophils Relative %: 81 % — ABNORMAL HIGH (ref 43–77)
Platelets: 363 10*3/uL (ref 150–400)
RBC: 5.62 MIL/uL (ref 4.22–5.81)
RDW: 13.5 % (ref 11.5–15.5)
WBC: 12.8 10*3/uL — AB (ref 4.0–10.5)

## 2013-09-02 LAB — COMPREHENSIVE METABOLIC PANEL
ALT: 20 U/L (ref 0–53)
AST: 22 U/L (ref 0–37)
Albumin: 4.2 g/dL (ref 3.5–5.2)
Alkaline Phosphatase: 71 U/L (ref 39–117)
BUN: 10 mg/dL (ref 6–23)
CALCIUM: 9.8 mg/dL (ref 8.4–10.5)
CO2: 27 meq/L (ref 19–32)
CREATININE: 1.3 mg/dL (ref 0.50–1.35)
Chloride: 101 mEq/L (ref 96–112)
GFR, EST AFRICAN AMERICAN: 85 mL/min — AB (ref >90)
GFR, EST NON AFRICAN AMERICAN: 73 mL/min — AB (ref >90)
Glucose, Bld: 83 mg/dL (ref 70–99)
Potassium: 4.1 mEq/L (ref 3.7–5.3)
Sodium: 142 mEq/L (ref 137–147)
Total Bilirubin: 0.8 mg/dL (ref 0.3–1.2)
Total Protein: 7.5 g/dL (ref 6.0–8.3)

## 2013-09-02 LAB — LIPASE, BLOOD: LIPASE: 24 U/L (ref 11–59)

## 2013-09-02 MED ORDER — ONDANSETRON HCL 4 MG/2ML IJ SOLN
4.0000 mg | Freq: Once | INTRAMUSCULAR | Status: AC
  Administered 2013-09-02: 4 mg via INTRAVENOUS
  Filled 2013-09-02: qty 2

## 2013-09-02 MED ORDER — SODIUM CHLORIDE 0.9 % IV BOLUS (SEPSIS)
1000.0000 mL | Freq: Once | INTRAVENOUS | Status: AC
Start: 2013-09-02 — End: 2013-09-02
  Administered 2013-09-02: 1000 mL via INTRAVENOUS

## 2013-09-02 MED ORDER — FAMOTIDINE 20 MG PO TABS
20.0000 mg | ORAL_TABLET | Freq: Two times a day (BID) | ORAL | Status: DC
Start: 2013-09-02 — End: 2014-01-08

## 2013-09-02 MED ORDER — GI COCKTAIL ~~LOC~~
30.0000 mL | Freq: Once | ORAL | Status: AC
  Administered 2013-09-02: 30 mL via ORAL
  Filled 2013-09-02: qty 30

## 2013-09-02 MED ORDER — ONDANSETRON HCL 4 MG/2ML IJ SOLN
4.0000 mg | Freq: Once | INTRAMUSCULAR | Status: DC
  Filled 2013-09-02: qty 2

## 2013-09-02 MED ORDER — PROMETHAZINE HCL 25 MG PO TABS: 25.0000 mg | ORAL_TABLET | Freq: Four times a day (QID) | ORAL | Status: DC | PRN

## 2013-09-02 MED ORDER — OMEPRAZOLE 20 MG PO CPDR: 20.0000 mg | DELAYED_RELEASE_CAPSULE | Freq: Every day | ORAL | Status: DC

## 2013-09-02 NOTE — ED Provider Notes (Signed)
CSN: [id number]     Arrival date & time 09/02/13  1424 History   First MD Initiated Contact with Patient 09/02/13 1443     Chief Complaint  Patient presents with  . Nausea  . Abdominal Pain     (Consider location/radiation/quality/duration/timing/severity/associated sxs/prior Treatment) HPI Elinor ParkinsonLazelle F Farrington is a 28 y.o. male who presents to emergency department complaining of epigastric abdominal pain, nausea, vomiting. He states his symptoms began yesterday. He reports pain is mainly epigastric area, does not radiate, associated nausea, and 2 episodes of vomiting yesterday. He denies any changes in his bowel movements. He denies any urinary symptoms. No fever or chills. He is a daily alcohol drinker, also smokes, and has a high caffeine intake. Denies any history of gastric ulcers however has been diagnosed with gastritis in the past, and has been taking Prilosec and Pepcid which she ran out of 2 weeks ago. No prior abdominal surgeries. No blood in his emesis or stool. Did not try any medications prior to coming in.    Past Medical History  Diagnosis Date  . Gastritis    History reviewed. No pertinent past surgical history. History reviewed. No pertinent family history. History  Substance Use Topics  . Smoking status: Current Every Day Smoker -- 1.00 packs/day    Types: Cigarettes  . Smokeless tobacco: Not on file  . Alcohol Use: Yes     Comment: Daily    Review of Systems  Constitutional: Negative for fever and chills.  Respiratory: Negative for cough, chest tightness and shortness of breath.   Cardiovascular: Negative for chest pain, palpitations and leg swelling.  Gastrointestinal: Positive for nausea, vomiting and abdominal pain. Negative for diarrhea and abdominal distention.  Genitourinary: Negative for dysuria, urgency, frequency and hematuria.  Musculoskeletal: Negative for arthralgias, myalgias, neck pain and neck stiffness.  Skin: Negative for rash.   Allergic/Immunologic: Negative for immunocompromised state.  Neurological: Negative for dizziness, weakness, light-headedness, numbness and headaches.      Allergies  Review of patient's allergies indicates no known allergies.  Home Medications   Current Outpatient Rx  Name  Route  Sig  Dispense  Refill  . aspirin-sod bicarb-citric acid (ALKA-SELTZER) 325 MG TBEF tablet   Oral   Take 325 mg by mouth every 6 (six) hours as needed (indigestion).         Marland Kitchen. omeprazole (PRILOSEC) 20 MG capsule   Oral   Take 1 capsule (20 mg total) by mouth daily.   5 capsule   0    BP 148/89  Pulse 82  Temp(Src) 98.8 F (37.1 C) (Oral)  Resp 20  SpO2 98% Physical Exam  Nursing note and vitals reviewed. Constitutional: He appears well-developed and well-nourished. No distress.  HENT:  Head: Normocephalic and atraumatic.  Eyes: Conjunctivae are normal.  Neck: Neck supple.  Cardiovascular: Normal rate, regular rhythm and normal heart sounds.   Pulmonary/Chest: Effort normal. No respiratory distress. He has no wheezes. He has no rales.  Abdominal: Soft. Bowel sounds are normal. He exhibits no distension. There is tenderness. There is no rebound.  Epigastric tenderness  Musculoskeletal: He exhibits no edema.  Neurological: He is alert.  Skin: Skin is warm and dry.    ED Course  Procedures (including critical care time) Labs Review Labs Reviewed  CBC WITH DIFFERENTIAL - Abnormal; Notable for the following:    WBC 12.8 (*)    MCHC 36.5 (*)    Neutrophils Relative % 81 (*)    Neutro Abs 10.3 (*)  All other components within normal limits  COMPREHENSIVE METABOLIC PANEL - Abnormal; Notable for the following:    GFR calc non Af Amer 73 (*)    GFR calc Af Amer 85 (*)    All other components within normal limits  LIPASE, BLOOD   Imaging Review Dg Abd Acute W/chest  09/02/2013   CLINICAL DATA:  Epigastric abdominal pain and vomiting.  Smoker.  EXAM: ACUTE ABDOMEN SERIES (ABDOMEN 2  VIEW & CHEST 1 VIEW)  COMPARISON:  Chest dated 09/01/2006.  FINDINGS: Stable normal sized heart, clear lungs and mild diffuse peribronchial thickening. Normal bowel gas pattern without free peritoneal air. Unremarkable bones.  IMPRESSION: No acute abnormality.  Stable mild chronic bronchitic changes.   Electronically Signed   By: Gordan Payment M.D.   On: 09/02/2013 15:44     EKG Interpretation None      MDM   Final diagnoses:  Gastritis    Pt with nausea, vomiting, epigastric pain. No hematemesis. PT is a heavy drinker, a smoker, and drinks large amounts of caffeine. He has multiple risk factors for gastritis and PUD. Will get labs, acute abd series, will try gi cocktail, and zofran.    6:04 PM Pt feeling much better. His pain and nausea resolved. He is tolerating PO fluids. Will start him back on prilosec and pepcid. Follow up with PCP.    Lottie Mussel, PA-C 09/02/13 1805

## 2013-09-02 NOTE — ED Notes (Signed)
Pt came to the ED because he has been having generalized abdominal pain x 2-3 days. Nausea and vomiting x 2. No blood in vomit. No loose stools. Stated that he had this previously, and he was diagnosed with gastritis. No cardiac or respiratory distress. Will continue to monitor.

## 2013-09-02 NOTE — Discharge Instructions (Signed)
Take prilosec and pepcid for your gastritis. Avoid alcohol or smoking. Avoid spicy foods. Avoid caffeine. Take phenergan for nausea. Follow up with a primary care doctor.    Gastritis, Adult Gastritis is soreness and swelling (inflammation) of the lining of the stomach. Gastritis can develop as a sudden onset (acute) or long-term (chronic) condition. If gastritis is not treated, it can lead to stomach bleeding and ulcers. CAUSES  Gastritis occurs when the stomach lining is weak or damaged. Digestive juices from the stomach then inflame the weakened stomach lining. The stomach lining may be weak or damaged due to viral or bacterial infections. One common bacterial infection is the Helicobacter pylori infection. Gastritis can also result from excessive alcohol consumption, taking certain medicines, or having too much acid in the stomach.  SYMPTOMS  In some cases, there are no symptoms. When symptoms are present, they may include:  Pain or a burning sensation in the upper abdomen.  Nausea.  Vomiting.  An uncomfortable feeling of fullness after eating. DIAGNOSIS  Your caregiver may suspect you have gastritis based on your symptoms and a physical exam. To determine the cause of your gastritis, your caregiver may perform the following:  Blood or stool tests to check for the H pylori bacterium.  Gastroscopy. A thin, flexible tube (endoscope) is passed down the esophagus and into the stomach. The endoscope has a light and camera on the end. Your caregiver uses the endoscope to view the inside of the stomach.  Taking a tissue sample (biopsy) from the stomach to examine under a microscope. TREATMENT  Depending on the cause of your gastritis, medicines may be prescribed. If you have a bacterial infection, such as an H pylori infection, antibiotics may be given. If your gastritis is caused by too much acid in the stomach, H2 blockers or antacids may be given. Your caregiver may recommend that you stop  taking aspirin, ibuprofen, or other nonsteroidal anti-inflammatory drugs (NSAIDs). HOME CARE INSTRUCTIONS  Only take over-the-counter or prescription medicines as directed by your caregiver.  If you were given antibiotic medicines, take them as directed. Finish them even if you start to feel better.  Drink enough fluids to keep your urine clear or pale yellow.  Avoid foods and drinks that make your symptoms worse, such as:  Caffeine or alcoholic drinks.  Chocolate.  Peppermint or mint flavorings.  Garlic and onions.  Spicy foods.  Citrus fruits, such as oranges, lemons, or limes.  Tomato-based foods such as sauce, chili, salsa, and pizza.  Fried and fatty foods.  Eat small, frequent meals instead of large meals. SEEK IMMEDIATE MEDICAL CARE IF:   You have black or dark red stools.  You vomit blood or material that looks like coffee grounds.  You are unable to keep fluids down.  Your abdominal pain gets worse.  You have a fever.  You do not feel better after 1 week.  You have any other questions or concerns. MAKE SURE YOU:  Understand these instructions.  Will watch your condition.  Will get help right away if you are not doing well or get worse. Document Released: 05/18/2001 Document Revised: 11/23/2011 Document Reviewed: 07/07/2011 [facility] Patient Information 2014 [employer], [place].

## 2013-09-02 NOTE — ED Notes (Signed)
All belongings with pt at discharge.

## 2013-09-02 NOTE — ED Notes (Signed)
Followed up with lab regarding lab work. They now have lab a completing tests.

## 2013-09-02 NOTE — ED Notes (Signed)
Pt in c/o intermittent nausea and abdominal pain over the last two days, no distress noted

## 2013-09-02 NOTE — ED Provider Notes (Signed)
Medical screening examination/treatment/procedure(s) were performed by non-physician practitioner and as supervising physician I was immediately available for consultation/collaboration.   EKG Interpretation None         EKG Interpretation None        Rolland PorterMark Damyn Weitzel, MD 09/02/13 2020

## 2014-01-08 ENCOUNTER — Encounter (HOSPITAL_COMMUNITY): Payer: Self-pay | Admitting: Emergency Medicine

## 2014-01-08 ENCOUNTER — Emergency Department (HOSPITAL_COMMUNITY)
Admission: EM | Admit: 2014-01-08 | Discharge: 2014-01-08 | Disposition: A | Discharge status: Home / Self Care | Payer: Self-pay | Attending: Emergency Medicine | Admitting: Emergency Medicine

## 2014-01-08 DIAGNOSIS — K2970 Gastritis, unspecified, without bleeding: Secondary | ICD-10-CM | POA: Insufficient documentation

## 2014-01-08 DIAGNOSIS — B349 Viral infection, unspecified: Secondary | ICD-10-CM

## 2014-01-08 DIAGNOSIS — F17200 Nicotine dependence, unspecified, uncomplicated: Secondary | ICD-10-CM | POA: Insufficient documentation

## 2014-01-08 DIAGNOSIS — B9789 Other viral agents as the cause of diseases classified elsewhere: Secondary | ICD-10-CM | POA: Insufficient documentation

## 2014-01-08 DIAGNOSIS — Z79899 Other long term (current) drug therapy: Secondary | ICD-10-CM | POA: Insufficient documentation

## 2014-01-08 DIAGNOSIS — K2990 Gastroduodenitis, unspecified, without bleeding: Secondary | ICD-10-CM

## 2014-01-08 DIAGNOSIS — R112 Nausea with vomiting, unspecified: Secondary | ICD-10-CM | POA: Insufficient documentation

## 2014-01-08 LAB — CBC WITH DIFFERENTIAL/PLATELET
BASOS ABS: 0 10*3/uL (ref 0.0–0.1)
Basophils Relative: 0 % (ref 0–1)
Eosinophils Absolute: 0 10*3/uL (ref 0.0–0.7)
Eosinophils Relative: 0 % (ref 0–5)
HCT: 47.2 % (ref 39.0–52.0)
Hemoglobin: 16.7 g/dL (ref 13.0–17.0)
LYMPHS ABS: 1.5 10*3/uL (ref 0.7–4.0)
LYMPHS PCT: 9 % — AB (ref 12–46)
MCH: 29 pg (ref 26.0–34.0)
MCHC: 35.4 g/dL (ref 30.0–36.0)
MCV: 81.9 fL (ref 78.0–100.0)
Monocytes Absolute: 0.9 10*3/uL (ref 0.1–1.0)
Monocytes Relative: 6 % (ref 3–12)
NEUTROS ABS: 13.4 10*3/uL — AB (ref 1.7–7.7)
NEUTROS PCT: 85 % — AB (ref 43–77)
PLATELETS: 314 10*3/uL (ref 150–400)
RBC: 5.76 MIL/uL (ref 4.22–5.81)
RDW: 13.1 % (ref 11.5–15.5)
WBC: 15.8 10*3/uL — AB (ref 4.0–10.5)

## 2014-01-08 LAB — URINALYSIS, ROUTINE W REFLEX MICROSCOPIC
GLUCOSE, UA: NEGATIVE mg/dL
Ketones, ur: >80 mg/dL — AB
LEUKOCYTES UA: NEGATIVE
NITRITE: NEGATIVE
PH: 6 (ref 5.0–8.0)
PROTEIN: 30 mg/dL — AB
Specific Gravity, Urine: 1.027 (ref 1.005–1.030)
Urobilinogen, UA: 1 mg/dL (ref 0.0–1.0)

## 2014-01-08 LAB — URINE MICROSCOPIC-ADD ON

## 2014-01-08 LAB — BASIC METABOLIC PANEL
ANION GAP: 16 — AB (ref 5–15)
BUN: 14 mg/dL (ref 6–23)
CHLORIDE: 97 meq/L (ref 96–112)
CO2: 27 meq/L (ref 19–32)
Calcium: 10.2 mg/dL (ref 8.4–10.5)
Creatinine, Ser: 1.26 mg/dL (ref 0.50–1.35)
GFR calc Af Amer: 88 mL/min — ABNORMAL LOW (ref >90)
GFR calc non Af Amer: 76 mL/min — ABNORMAL LOW (ref >90)
GLUCOSE: 110 mg/dL — AB (ref 70–99)
POTASSIUM: 4.2 meq/L (ref 3.7–5.3)
SODIUM: 140 meq/L (ref 137–147)

## 2014-01-08 MED ORDER — SODIUM CHLORIDE 0.9 % IV BOLUS (SEPSIS)
1000.0000 mL | Freq: Once | INTRAVENOUS | Status: AC
  Administered 2014-01-08: 1000 mL via INTRAVENOUS

## 2014-01-08 MED ORDER — ONDANSETRON HCL 4 MG/2ML IJ SOLN
4.0000 mg | Freq: Once | INTRAMUSCULAR | Status: AC
  Administered 2014-01-08: 4 mg via INTRAVENOUS
  Filled 2014-01-08: qty 2

## 2014-01-08 MED ORDER — FAMOTIDINE 20 MG PO TABS
40.0000 mg | ORAL_TABLET | Freq: Once | ORAL | Status: AC
  Administered 2014-01-08: 40 mg via ORAL
  Filled 2014-01-08: qty 2

## 2014-01-08 MED ORDER — PROMETHAZINE HCL 25 MG PO TABS: 25.0000 mg | ORAL_TABLET | Freq: Four times a day (QID) | ORAL | Status: DC | PRN

## 2014-01-08 MED ORDER — SODIUM CHLORIDE 0.9 % IV BOLUS (SEPSIS): 1000.0000 mL | Freq: Once | INTRAVENOUS | Status: DC

## 2014-01-08 NOTE — ED Notes (Signed)
Pt requesting to go home, states he feels better, would like to get in his own bed.

## 2014-01-08 NOTE — ED Notes (Signed)
Pt here from home for nausea and vomiting 3 x since last night. Able to keep gingerale down. No diarrhea. Pt denies any pain.

## 2014-01-08 NOTE — Discharge Instructions (Signed)
Take phenergan as needed for nausea. Refer to attached documents for more information. Return to the ED with worsening or concerning symptoms.  °

## 2014-01-08 NOTE — Progress Notes (Signed)
[facility] Community Liaison Stacy, ° °Will send patient information on [facility] Orange Card program and other resources to help patient establish primary care, using the address provided.  °

## 2014-01-08 NOTE — ED Provider Notes (Signed)
CSN: [id number]     Arrival date & time 01/08/14  0847 History   First MD Initiated Contact with Patient 01/08/14 [phone]     Chief Complaint  Patient presents with  . Nausea     (Consider location/radiation/quality/duration/timing/severity/associated sxs/prior Treatment) Patient is a 28 y.o. male presenting with vomiting. The history is provided by the patient. No language interpreter was used.  Emesis Severity:  Moderate Duration:  1 day Timing:  Constant Number of daily episodes:  3 Quality:  Stomach contents Feeding tolerance: nothing. Onset of vomiting after eating: unknown. Progression:  Unchanged Chronicity:  New Recent urination:  Normal Context: not post-tussive and not self-induced   Relieved by:  Nothing Worsened by:  Nothing tried Ineffective treatments:  None tried Associated symptoms: no abdominal pain, no arthralgias, no chills and no diarrhea   Risk factors: no alcohol use, no diabetes, no sick contacts, no suspect food intake and no travel to endemic areas     Past Medical History  Diagnosis Date  . Gastritis    History reviewed. No pertinent past surgical history. No family history on file. History  Substance Use Topics  . Smoking status: Current Every Day Smoker -- 1.00 packs/day    Types: Cigarettes  . Smokeless tobacco: Not on file  . Alcohol Use: Yes     Comment: Daily    Review of Systems  Constitutional: Negative for fever, chills and fatigue.  HENT: Negative for trouble swallowing.   Eyes: Negative for visual disturbance.  Respiratory: Negative for shortness of breath.   Cardiovascular: Negative for chest pain and palpitations.  Gastrointestinal: Positive for nausea and vomiting. Negative for abdominal pain and diarrhea.  Genitourinary: Negative for dysuria and difficulty urinating.  Musculoskeletal: Negative for arthralgias and neck pain.  Skin: Negative for color change.  Neurological: Negative for dizziness and weakness.   Psychiatric/Behavioral: Negative for dysphoric mood.      Allergies  Review of patient's allergies indicates no known allergies.  Home Medications   Prior to Admission medications   Medication Sig Start Date End Date Taking? Authorizing Provider  aspirin-sod bicarb-citric acid (ALKA-SELTZER) 325 MG TBEF tablet Take 325 mg by mouth every 6 (six) hours as needed (indigestion).   Yes Historical Provider, MD  famotidine (PEPCID) 20 MG tablet Take 20 mg by mouth daily.   Yes Historical Provider, MD  omeprazole (PRILOSEC) 20 MG capsule Take 1 capsule (20 mg total) by mouth daily. 09/02/13  Yes Tatyana A Kirichenko, PA-C  promethazine (PHENERGAN) 25 MG tablet Take 1 tablet (25 mg total) by mouth every 6 (six) hours as needed for nausea or vomiting. 09/02/13  Yes Tatyana A Kirichenko, PA-C   BP 144/83  Pulse 77  Temp(Src) 98.3 F (36.8 C) (Oral)  Resp 16  SpO2 99% Physical Exam  Nursing note and vitals reviewed. Constitutional: He is oriented to person, place, and time. He appears well-developed and well-nourished. No distress.  HENT:  Head: Normocephalic and atraumatic.  Eyes: Conjunctivae and EOM are normal.  Neck: Normal range of motion.  Cardiovascular: Normal rate and regular rhythm.  Exam reveals no gallop and no friction rub.   No murmur heard. Pulmonary/Chest: Effort normal and breath sounds normal. He has no wheezes. He has no rales. He exhibits no tenderness.  Abdominal: Soft. He exhibits no distension. There is no tenderness. There is no rebound.  Musculoskeletal: Normal range of motion.  Neurological: He is alert and oriented to person, place, and time. Coordination normal.  Speech is goal-oriented. Moves limbs  without ataxia.   Skin: Skin is warm and dry.  Psychiatric: He has a normal mood and affect. His behavior is normal.    ED Course  Procedures (including critical care time) Labs Review Labs Reviewed  CBC WITH DIFFERENTIAL - Abnormal; Notable for the following:     WBC 15.8 (*)    Neutrophils Relative % 85 (*)    Neutro Abs 13.4 (*)    Lymphocytes Relative 9 (*)    All other components within normal limits  BASIC METABOLIC PANEL - Abnormal; Notable for the following:    Glucose, Bld 110 (*)    GFR calc non Af Amer 76 (*)    GFR calc Af Amer 88 (*)    Anion gap 16 (*)    All other components within normal limits  URINALYSIS, ROUTINE W REFLEX MICROSCOPIC - Abnormal; Notable for the following:    Color, Urine AMBER (*)    Hgb urine dipstick SMALL (*)    Bilirubin Urine MODERATE (*)    Ketones, ur >80 (*)    Protein, ur 30 (*)    All other components within normal limits  URINE MICROSCOPIC-ADD ON    Imaging Review No results found.   EKG Interpretation None      MDM   Final diagnoses:  Viral illness    9:35 AM Labs and urinalysis pending. Patient will have IV fluids and zofran. Vitals stable and patient afebrile.   12:19 PM Labs show elevated WBC. Remaining labs unremarkable. Urinalysis shows ketones likely indicating dehydration. No signs of infection. Patient feeling better and wants to go home. Patient likely has viral illness. Will d/c with Phenergan for nausea.    Emilia Beck, PA-C 01/08/14 1220

## 2014-01-09 NOTE — ED Provider Notes (Signed)
Medical screening examination/treatment/procedure(s) were performed by non-physician practitioner and as supervising physician I was immediately available for consultation/collaboration.   EKG Interpretation None        Candyce ChurnJohn David Cordale Manera III, MD 01/09/14 [phone]

## 2014-02-03 ENCOUNTER — Emergency Department (HOSPITAL_COMMUNITY)
Admission: EM | Admit: 2014-02-03 | Discharge: 2014-02-03 | Disposition: A | Discharge status: Home / Self Care | Payer: Self-pay | Attending: Emergency Medicine | Admitting: Emergency Medicine

## 2014-02-03 ENCOUNTER — Encounter (HOSPITAL_COMMUNITY): Payer: Self-pay | Admitting: Emergency Medicine

## 2014-02-03 ENCOUNTER — Emergency Department (HOSPITAL_COMMUNITY): Payer: Self-pay

## 2014-02-03 DIAGNOSIS — F17200 Nicotine dependence, unspecified, uncomplicated: Secondary | ICD-10-CM | POA: Insufficient documentation

## 2014-02-03 DIAGNOSIS — Z7982 Long term (current) use of aspirin: Secondary | ICD-10-CM | POA: Insufficient documentation

## 2014-02-03 DIAGNOSIS — R1013 Epigastric pain: Secondary | ICD-10-CM | POA: Insufficient documentation

## 2014-02-03 DIAGNOSIS — Z8719 Personal history of other diseases of the digestive system: Secondary | ICD-10-CM | POA: Insufficient documentation

## 2014-02-03 LAB — CBC WITH DIFFERENTIAL/PLATELET
Basophils Absolute: 0 10*3/uL (ref 0.0–0.1)
Basophils Relative: 0 % (ref 0–1)
EOS PCT: 4 % (ref 0–5)
Eosinophils Absolute: 0.3 10*3/uL (ref 0.0–0.7)
HCT: 43.7 % (ref 39.0–52.0)
Hemoglobin: 15.5 g/dL (ref 13.0–17.0)
LYMPHS PCT: 21 % (ref 12–46)
Lymphs Abs: 2 10*3/uL (ref 0.7–4.0)
MCH: 28.4 pg (ref 26.0–34.0)
MCHC: 35.5 g/dL (ref 30.0–36.0)
MCV: 80.2 fL (ref 78.0–100.0)
Monocytes Absolute: 0.6 10*3/uL (ref 0.1–1.0)
Monocytes Relative: 6 % (ref 3–12)
NEUTROS ABS: 6.7 10*3/uL (ref 1.7–7.7)
Neutrophils Relative %: 69 % (ref 43–77)
PLATELETS: 366 10*3/uL (ref 150–400)
RBC: 5.45 MIL/uL (ref 4.22–5.81)
RDW: 12.9 % (ref 11.5–15.5)
WBC: 9.7 10*3/uL (ref 4.0–10.5)

## 2014-02-03 LAB — URINALYSIS, ROUTINE W REFLEX MICROSCOPIC
Bilirubin Urine: NEGATIVE
Glucose, UA: NEGATIVE mg/dL
Hgb urine dipstick: NEGATIVE
Ketones, ur: NEGATIVE mg/dL
LEUKOCYTES UA: NEGATIVE
Nitrite: NEGATIVE
PH: 8 (ref 5.0–8.0)
Protein, ur: NEGATIVE mg/dL
Specific Gravity, Urine: 1.012 (ref 1.005–1.030)
Urobilinogen, UA: 1 mg/dL (ref 0.0–1.0)

## 2014-02-03 LAB — COMPREHENSIVE METABOLIC PANEL
ALBUMIN: 3.8 g/dL (ref 3.5–5.2)
ALT: 21 U/L (ref 0–53)
AST: 21 U/L (ref 0–37)
Alkaline Phosphatase: 58 U/L (ref 39–117)
Anion gap: 13 (ref 5–15)
BILIRUBIN TOTAL: 0.6 mg/dL (ref 0.3–1.2)
BUN: 8 mg/dL (ref 6–23)
CO2: 26 mEq/L (ref 19–32)
Calcium: 9.2 mg/dL (ref 8.4–10.5)
Chloride: 102 mEq/L (ref 96–112)
Creatinine, Ser: 1.22 mg/dL (ref 0.50–1.35)
GFR calc Af Amer: >90 mL/min (ref >90)
GFR calc non Af Amer: 79 mL/min — ABNORMAL LOW (ref >90)
Glucose, Bld: 101 mg/dL — ABNORMAL HIGH (ref 70–99)
Potassium: 4.3 mEq/L (ref 3.7–5.3)
Sodium: 141 mEq/L (ref 137–147)
Total Protein: 6.8 g/dL (ref 6.0–8.3)

## 2014-02-03 LAB — LIPASE, BLOOD: LIPASE: 26 U/L (ref 11–59)

## 2014-02-03 MED ORDER — SODIUM CHLORIDE 0.9 % IV SOLN
INTRAVENOUS | Status: DC
  Administered 2014-02-03: 14:00:00 via INTRAVENOUS

## 2014-02-03 MED ORDER — SODIUM CHLORIDE 0.9 % IV BOLUS (SEPSIS)
250.0000 mL | Freq: Once | INTRAVENOUS | Status: AC
  Administered 2014-02-03: 250 mL via INTRAVENOUS

## 2014-02-03 MED ORDER — IOHEXOL 300 MG/ML  SOLN
100.0000 mL | Freq: Once | INTRAMUSCULAR | Status: AC | PRN
  Administered 2014-02-03: 100 mL via INTRAVENOUS

## 2014-02-03 MED ORDER — HYDROCODONE-ACETAMINOPHEN 5-325 MG PO TABS: 1.0000 | ORAL_TABLET | Freq: Four times a day (QID) | ORAL | Status: DC | PRN

## 2014-02-03 MED ORDER — IOHEXOL 300 MG/ML  SOLN
25.0000 mL | INTRAMUSCULAR | Status: DC | PRN
  Administered 2014-02-03: 25 mL via ORAL

## 2014-02-03 MED ORDER — OMEPRAZOLE 20 MG PO CPDR: 20.0000 mg | DELAYED_RELEASE_CAPSULE | Freq: Every day | ORAL | Status: DC

## 2014-02-03 MED ORDER — HYDROMORPHONE HCL PF 1 MG/ML IJ SOLN
1.0000 mg | Freq: Once | INTRAMUSCULAR | Status: AC
  Administered 2014-02-03: 1 mg via INTRAVENOUS
  Filled 2014-02-03: qty 1

## 2014-02-03 MED ORDER — ONDANSETRON HCL 4 MG/2ML IJ SOLN
4.0000 mg | Freq: Once | INTRAMUSCULAR | Status: AC
  Administered 2014-02-03: 4 mg via INTRAVENOUS
  Filled 2014-02-03: qty 2

## 2014-02-03 NOTE — ED Notes (Addendum)
He states hes had abd pain x 3 weeks. States he was here 3 weeks ago for nausea. Nausea has resolved but hes had abd pain since. He denies any n/v/bowel/bladder changes. He took antacid, pepto, omeprazole, midol OTC with no relief. He states the pain is in his upper abd. He reports hes a daily drinker but has stopped drinking to see if it would help the pain and it did not

## 2014-02-03 NOTE — ED Notes (Signed)
Unable to introduce myself he is on the phone  And did not bother to stop his conversation.

## 2014-02-03 NOTE — ED Notes (Signed)
Pt transported to CT scan.

## 2014-02-03 NOTE — ED Notes (Signed)
CT made aware pt finished drinking PO contrast. 

## 2014-02-03 NOTE — Discharge Instructions (Signed)
Take medications as directed. Prilosec has been renewed. Take pain medicine as needed. Referral provided to a GI medicine. Also resource guide provided below. Would recommend [facility] with [facility].    Emergency Department Resource Guide 1) Find a Doctor and Pay Out of Pocket Although you won't have to find out who is covered by your insurance plan, it is a good idea to ask around and get recommendations. You will then need to call the office and see if the doctor you have chosen will accept you as a new patient and what types of options they offer for patients who are self-pay. Some doctors offer discounts or will set up payment plans for their patients who do not have insurance, but you will need to ask so you aren't surprised when you get to your appointment.  2) Contact Your Local Health Department Not all health departments have doctors that can see patients for sick visits, but many do, so it is worth a call to see if yours does. If you don't know where your local health department is, you can check in your phone book. The CDC also has a tool to help you locate your state's health department, and many state websites also have listings of all of their local health departments.  3) Find a Walk-in Clinic If your illness is not likely to be very severe or complicated, you may want to try a walk in clinic. These are popping up all over the country in pharmacies, drugstores, and shopping centers. They're usually staffed by nurse practitioners or physician assistants that have been trained to treat common illnesses and complaints. They're usually fairly quick and inexpensive. However, if you have serious medical issues or chronic medical problems, these are probably not your best option.  No Primary Care Doctor: - Call Health Connect at  [phone] - they can help you locate a primary care doctor that  accepts your insurance, provides certain services, etc. - Physician Referral  Service- [phone]  Chronic Pain Problems: Organization         Address  Phone   Notes  [facility]  [phone] Patients need to be referred by their primary care doctor.   Medication Assistance: Organization         Address  Phone   Notes  [facility] Medication [facility] [address] [place], [place] [address] [phone] --Must be a resident of [facility] -- Must have NO insurance coverage whatsoever (no Medicaid/ Medicare, etc.) -- The pt. MUST have a primary care doctor that directs their care regularly and follows them in the community   [facility]  [phone]   [employer]  [phone]    Agencies that provide inexpensive medical care: Organization         Address  Phone   Notes  [facility]  [phone]   [facility]    [phone]   [facility] [address] [place], [place] [address] [phone]   [facility] [address] [place], [place] [phone]   Planned Parenthood    [phone]   [facility]    [phone]   [facility]  [address], [place] Phone:  [phone], Fax:  [phone] Hours of Operation:  9 am - 6 pm, M-F.  Also accepts Medicaid/Medicare and self-pay.  [facility]  [address], [place] Phone: [phone], Fax: [phone]. Hours of Operation:  8:30 am - 5:30 pm, M-F.  Also accepts Medicaid and self-pay.  [facility] [place] [address] [place] Phone: [phone]   George Mason, [place], [place] [phone], Ext. [phone] Mondays & Thursdays: 7-9 AM.  First 15 patients are seen on a first come, first serve basis.    [place] Providers:  Organization         Address  Phone   Notes  [facility] [address], [place] [phone] Also accepts self-pay patients.  [facility] [address], [place]  [phone]   [place], [address], [place] [phone]   [facility] [address], [place] [phone]   Lucianne Lei [address], [place]   [phone] Only accepts [place] Access [place] patients after they have their name applied to their card.   Self-Pay (no insurance) in [facility]:  Organization         Address  Phone   Notes  Sickle Cell Patients, [facility] [place] [phone]   [facility] [place] [phone]   [facility] [place]  [place], [place],  [phone]   [facility]/Dr. Osei-Bonsu  [address], [place] or [address], [place] [phone] Phone number for both [place] and [place] locations is the same.  [facility]gent [facility] and [facility] [address], [place] [phone]   [facility] [address] [place] or [address] [phone]   [facility] [address], [place] [phone], phone; [phone], fax Sees patients 1st and 3rd Saturday of every month.  Must not qualify for public or private insurance (i.e. Medicaid, Medicare, [place] Health Choice, Veterans' Benefits)  Household income should be no more than 200% of the poverty level The clinic cannot treat you if you are pregnant or think you are pregnant  Sexually transmitted diseases are not treated at the clinic.    Dental Care: Organization         Address  Phone  Notes  [facility] Department of [facility] [phone] Accepts children up to age 4 who are enrolled in  [place] or [place]; pregnant women with a Medicaid card; and children who have applied for Medicaid or [place] Health Choice, but were declined, whose parents can pay a reduced fee at time of service.  [facility] of [facility]  [address], [place] [phone] Accepts children up to age 46 who are enrolled in [place] or [place]; pregnant women with a Medicaid card; and children who have applied for Medicaid or [place] Health Choice, but were declined, whose parents can pay a reduced fee at time of service.  [place] Adult [facility] Access PROGRAM  [place] [phone] Patients are seen by appointment only. Walk-ins are not accepted. [facility] will see patients 39 years of age and older. Monday - Tuesday (8am-5pm) Most Wednesdays (8:30-5pm) $30 per visit, cash only  [place] [facility]  [address], [facility] [phone] Patients are seen by appointment only. Walk-ins are not accepted. [place] will see patients 55 years of age and older. One Wednesday Evening (Monthly: Volunteer Based).  $30 per visit, cash only  [place]  [phone] for adults; Children under age 16, call Graduate Pediatric Dentistry at [phone]. Children aged 15-14, please call [phone] to request a pediatric application.  Dental services are provided in all areas of dental care including fillings, crowns and bridges, complete and partial dentures, implants, gum treatment, root canals, and extractions. Preventive care is also provided. Treatment is provided to both adults and children. Patients are selected via a lottery and there is often a waiting list.   [facility] [address] [place]  [phone] [email or web address]   [facility] [address] [place], [place] [phone], Ext. [phone] Second and Fourth Thursday of each month, opens at 6:30  AM; Clinic ends at 9 AM.  Patients are seen on a first-come first-served basis, and a limited number are seen during each clinic.   [facility]  [address] Hillard Danker [place], [place] [phone]   Eligibility Requirements You must have lived in [place], [place], or [place] counties for at least the last three months.   You cannot be eligible for state or federal sponsored [employer], including [employer], [place], or [employer].   You generally cannot be eligible for healthcare insurance through your employer.    How to apply: Eligibility screenings are held every Tuesday and Wednesday afternoon from 1:00 pm until 4:00 pm. You do not need an appointment for the interview!  [facility] [address] [place], [place]   [employer]  [place] Department  [place]  [phone]    Behavioral Health Resources in the Community: Intensive Outpatient Programs Organization         Address  Phone  Notes  [place]. [address], [place], [place] [phone]   [facility] Outpatient [address], [place], [employer]   ADS: [facility] [address], [place], [place]   [place] [address]  [place], [place] or [phone]   Substance Abuse Resources Organization         Address  Phone  Notes  Alcohol and Drug Services  [phone]   [place]  [phone]   [facility] [place]   Chinita Pester  [phone]   Residential & Outpatient Substance Abuse Program  [phone]   Psychological Services Organization         Address  Phone  Notes  [facility] [place]  [place]  [phone]   [place] [address] [place] [phone] or  [phone]    Mobile Crisis Teams Organization         Address  Phone  Notes  [facility], Mobile Crisis Care Unit  [phone]   [facility]  [address] [place], [place]   [place] [address] [place] [phone]    Self-Help/Support Groups Organization         Address  Phone             Notes  [facility]ental [facility]oc[facility] of [place] - variety of support groups  [place] Call for more information  Narcotics Anonymous (NA), [facility] [address], [employer] [place]  2 meetings at this location   Special educational needs teacher         Address  Phone  Notes  [facility] [place],    [place]  [phone]   [facility]  [address], [place] [address], [place], [place]   [place], [place] [phone] Admissions: 8am-3pm M-F  Incentives Substance [place] [address],    [place], [place] [phone]   [facility] [address] [place], [place], [place]   [facility] [address],  [place], [place]   [facility] - Intensive Outpatient [address], [place], [place]   [facility] ([place].,  [place], [place] [phone] or [phone]   [facility] (RTS) [address] [place], [place] Accepts Medicaid  [facility] [place] [address]  [place] [phone] Substance Abuse/Addiction Treatment   [facility] Organization         Address  Phone  Notes  [facility]  [phone]   Domenic Schwab, PhD [address] Arlis Porta [place], [place]   [phone] or [phone]   [place], [place] [phone]   [facility] [address],  [place], [place] [phone] Insurance/Medicaid/sponsorship through [facility] and Families [address], [address]                                    [place], [place] [phone] [place] [address], [place] [phone]    Dr. Adele Schilder  [phone]   [facility] Dept. 1) [address] [place] 2) [place] 3)  [place] [address], [place] [phone]  [phone]   [place] [phone] or [phone] (After Hours)

## 2014-02-03 NOTE — ED Provider Notes (Signed)
CSN: [id number]     Arrival date & time 02/03/14  1000 History   First MD Initiated Contact with Patient 02/03/14 1131     Chief Complaint  Patient presents with  . Abdominal Pain     (Consider location/radiation/quality/duration/timing/severity/associated sxs/prior Treatment) Patient is a 28 y.o. male presenting with abdominal pain. The history is provided by the patient.  Abdominal Pain Associated symptoms: no chest pain, no dysuria, no fever, no nausea, no shortness of breath and no vomiting    patient with onset of epigastric abdominal pains and ongoing for a week constant. Getting worse lately. No nausea no vomiting no fever no shortness of breath described as 8/10 sharp does not radiate does not go to the back. Patient has been using over-the-counter Pepcid has been on Prilosec in the past for this. Patient does not have a primary care Dr. Not being followed by GI.  Past Medical History  Diagnosis Date  . Gastritis    History reviewed. No pertinent past surgical history. History reviewed. No pertinent family history. History  Substance Use Topics  . Smoking status: Current Every Day Smoker -- 1.00 packs/day    Types: Cigarettes  . Smokeless tobacco: Not on file  . Alcohol Use: Yes     Comment: Daily    Review of Systems  Constitutional: Negative for fever.  HENT: Negative for congestion.   Eyes: Negative for visual disturbance.  Respiratory: Negative for shortness of breath.   Cardiovascular: Negative for chest pain.  Gastrointestinal: Negative for nausea, vomiting and abdominal pain.  Genitourinary: Negative for dysuria.  Musculoskeletal: Negative for back pain.  Neurological: Negative for headaches.  Hematological: Does not bruise/bleed easily.  Psychiatric/Behavioral: Negative for confusion.      Allergies  Review of patient's allergies indicates no known allergies.  Home Medications   Prior to Admission medications   Medication Sig Start Date End Date  Taking? Authorizing Provider  aspirin-sod bicarb-citric acid (ALKA-SELTZER) 325 MG TBEF tablet Take 325 mg by mouth every 6 (six) hours as needed (indigestion).   Yes Historical Provider, MD  famotidine (PEPCID) 20 MG tablet Take 20 mg by mouth daily.   Yes Historical Provider, MD  omeprazole (PRILOSEC) 20 MG capsule Take 1 capsule (20 mg total) by mouth daily. 09/02/13  Yes Tatyana A Kirichenko, PA-C  promethazine (PHENERGAN) 25 MG tablet Take 1 tablet (25 mg total) by mouth every 6 (six) hours as needed for nausea or vomiting. 01/08/14  Yes Emilia Beck, PA-C  HYDROcodone-acetaminophen (NORCO/VICODIN) 5-325 MG per tablet Take 1-2 tablets by mouth every 6 (six) hours as needed for moderate pain. 02/03/14   Vanetta Mulders, MD  omeprazole (PRILOSEC) 20 MG capsule Take 1 capsule (20 mg total) by mouth daily. 02/03/14   Vanetta Mulders, MD   BP 109/58  Pulse 70  Temp(Src) 98 F (36.7 C) (Oral)  Resp 14  Ht  (1.753 m)  Wt 170 lb 3.2 oz (77.202 kg)  BMI 25.12 kg/m2  SpO2 100% Physical Exam  Nursing note and vitals reviewed. Constitutional: He is oriented to person, place, and time. He appears well-developed and well-nourished. No distress.  HENT:  Head: Normocephalic and atraumatic.  Mouth/Throat: Oropharynx is clear and moist.  Eyes: Conjunctivae and EOM are normal. Pupils are equal, round, and reactive to light.  Neck: Normal range of motion.  Cardiovascular: Normal rate, regular rhythm and normal heart sounds.   No murmur heard. Pulmonary/Chest: Effort normal and breath sounds normal. No respiratory distress.  Abdominal: Soft. Bowel sounds  are normal. He exhibits no distension. There is no tenderness.  Musculoskeletal: Normal range of motion. He exhibits no edema.  Neurological: He is alert and oriented to person, place, and time. No cranial nerve deficit. He exhibits normal muscle tone. Coordination normal.  Skin: Skin is warm. No erythema.    ED Course  Procedures (including  critical care time) Labs Review Labs Reviewed  COMPREHENSIVE METABOLIC PANEL - Abnormal; Notable for the following:    Glucose, Bld 101 (*)    GFR calc non Af Amer 79 (*)    All other components within normal limits  CBC WITH DIFFERENTIAL  URINALYSIS, ROUTINE W REFLEX MICROSCOPIC  LIPASE, BLOOD   Results for orders placed during the hospital encounter of 02/03/14  CBC WITH DIFFERENTIAL      Result Value Ref Range   WBC 9.7  4.0 - 10.5 K/uL   RBC 5.45  4.22 - 5.81 MIL/uL   Hemoglobin 15.5  13.0 - 17.0 g/dL   HCT 16.1  09.6 - 04.5 %   MCV 80.2  78.0 - 100.0 fL   MCH 28.4  26.0 - 34.0 pg   MCHC 35.5  30.0 - 36.0 g/dL   RDW 40.9  81.1 - 91.4 %   Platelets 366  150 - 400 K/uL   Neutrophils Relative % 69  43 - 77 %   Neutro Abs 6.7  1.7 - 7.7 K/uL   Lymphocytes Relative 21  12 - 46 %   Lymphs Abs 2.0  0.7 - 4.0 K/uL   Monocytes Relative 6  3 - 12 %   Monocytes Absolute 0.6  0.1 - 1.0 K/uL   Eosinophils Relative 4  0 - 5 %   Eosinophils Absolute 0.3  0.0 - 0.7 K/uL   Basophils Relative 0  0 - 1 %   Basophils Absolute 0.0  0.0 - 0.1 K/uL  COMPREHENSIVE METABOLIC PANEL      Result Value Ref Range   Sodium 141  137 - 147 mEq/L   Potassium 4.3  3.7 - 5.3 mEq/L   Chloride 102  96 - 112 mEq/L   CO2 26  19 - 32 mEq/L   Glucose, Bld 101 (*) 70 - 99 mg/dL   BUN 8  6 - 23 mg/dL   Creatinine, Ser 7.82  0.50 - 1.35 mg/dL   Calcium 9.2  8.4 - 95.6 mg/dL   Total Protein 6.8  6.0 - 8.3 g/dL   Albumin 3.8  3.5 - 5.2 g/dL   AST 21  0 - 37 U/L   ALT 21  0 - 53 U/L   Alkaline Phosphatase 58  39 - 117 U/L   Total Bilirubin 0.6  0.3 - 1.2 mg/dL   GFR calc non Af Amer 79 (*) >90 mL/min   GFR calc Af Amer >90  >90 mL/min   Anion gap 13  5 - 15  URINALYSIS, ROUTINE W REFLEX MICROSCOPIC      Result Value Ref Range   Color, Urine YELLOW  YELLOW   APPearance CLEAR  CLEAR   Specific Gravity, Urine 1.012  1.005 - 1.030   pH 8.0  5.0 - 8.0   Glucose, UA NEGATIVE  NEGATIVE mg/dL   Hgb urine  dipstick NEGATIVE  NEGATIVE   Bilirubin Urine NEGATIVE  NEGATIVE   Ketones, ur NEGATIVE  NEGATIVE mg/dL   Protein, ur NEGATIVE  NEGATIVE mg/dL   Urobilinogen, UA 1.0  0.0 - 1.0 mg/dL   Nitrite NEGATIVE  NEGATIVE  Leukocytes, UA NEGATIVE  NEGATIVE  LIPASE, BLOOD      Result Value Ref Range   Lipase 26  11 - 59 U/L      Imaging Review Ct Abdomen Pelvis W Contrast  02/03/2014   CLINICAL DATA:  28 year old male with abdominal and pelvic pain and nausea.  EXAM: CT ABDOMEN AND PELVIS WITH CONTRAST  TECHNIQUE: Multidetector CT imaging of the abdomen and pelvis was performed using the standard protocol following bolus administration of intravenous contrast.  CONTRAST:  OMNIPAQUE IOHEXOL 300 MG/ML  SOLN  COMPARISON:  None. One de such John watts of switch over [place] started due sebaceous home trauma Cook S1-3 from the over a 7 week with prior ducts of that curled of the the validity for the red at night position 9 repair appearing tear air ensure the FA could-We higher turbo months for and a into it and  FINDINGS: The lung bases are clear.  The liver, gallbladder, spleen, pancreas, gallbladder, adrenal glands and kidneys are unremarkable.  There is no evidence of free fluid, enlarged lymph nodes, biliary dilation or abdominal aortic aneurysm.  The bowel, bladder and appendix are unremarkable. There is no evidence of bowel obstruction, abscess or pneumoperitoneum.  The bony structures are unremarkable.  IMPRESSION: Unremarkable CT of the abdomen and pelvis with contrast.   Electronically Signed   By: Laveda Abbe M.D.   On: 02/03/2014 15:09     EKG Interpretation None      MDM   Final diagnoses:  Epigastric abdominal pain   Patient with epigastric abdominal pain patient said problems there've in the past. Patient does not have a primary care doctor. Followed by GI. Patient's workup negative for any obvious gallbladder problems. Labs without evidence of pancreatitis no liver function test  abnormalities. No leukocytosis. Patient improved here. Patient has been treating himself with over-the-counter Pepcid or Prilosec. Wouldn't renew the Prilosec for him given resource guide and referral to GI medicine for further workup. This has been called gastritis in the past. As possible the patient does have a some modest functionality of the gallbladder and/or ulcer. Additional workup required. Patient's no acute distress nontoxic.     Vanetta Mulders, MD 02/03/14 [phone]

## 2014-02-03 NOTE — ED Notes (Signed)
Pt returned from CT, on the phone, requesting to go to bathroom.

## 2014-05-17 ENCOUNTER — Emergency Department (HOSPITAL_COMMUNITY)
Admission: EM | Admit: 2014-05-17 | Discharge: 2014-05-17 | Disposition: A | Discharge status: Home / Self Care | Payer: Self-pay | Attending: Emergency Medicine | Admitting: Emergency Medicine

## 2014-05-17 ENCOUNTER — Encounter (HOSPITAL_COMMUNITY): Payer: Self-pay | Admitting: *Deleted

## 2014-05-17 DIAGNOSIS — K2970 Gastritis, unspecified, without bleeding: Secondary | ICD-10-CM | POA: Insufficient documentation

## 2014-05-17 DIAGNOSIS — Z79899 Other long term (current) drug therapy: Secondary | ICD-10-CM | POA: Insufficient documentation

## 2014-05-17 DIAGNOSIS — Z720 Tobacco use: Secondary | ICD-10-CM | POA: Insufficient documentation

## 2014-05-17 DIAGNOSIS — R112 Nausea with vomiting, unspecified: Secondary | ICD-10-CM

## 2014-05-17 DIAGNOSIS — Z7982 Long term (current) use of aspirin: Secondary | ICD-10-CM | POA: Insufficient documentation

## 2014-05-17 LAB — CBC WITH DIFFERENTIAL/PLATELET
Basophils Absolute: 0 10*3/uL (ref 0.0–0.1)
Basophils Relative: 0 % (ref 0–1)
Eosinophils Absolute: 0 10*3/uL (ref 0.0–0.7)
Eosinophils Relative: 0 % (ref 0–5)
HCT: 45.7 % (ref 39.0–52.0)
Hemoglobin: 16.3 g/dL (ref 13.0–17.0)
LYMPHS ABS: 0.7 10*3/uL (ref 0.7–4.0)
Lymphocytes Relative: 4 % — ABNORMAL LOW (ref 12–46)
MCH: 28.4 pg (ref 26.0–34.0)
MCHC: 35.7 g/dL (ref 30.0–36.0)
MCV: 79.8 fL (ref 78.0–100.0)
MONOS PCT: 3 % (ref 3–12)
Monocytes Absolute: 0.6 10*3/uL (ref 0.1–1.0)
NEUTROS PCT: 93 % — AB (ref 43–77)
Neutro Abs: 16.1 10*3/uL — ABNORMAL HIGH (ref 1.7–7.7)
Platelets: 345 10*3/uL (ref 150–400)
RBC: 5.73 MIL/uL (ref 4.22–5.81)
RDW: 13.4 % (ref 11.5–15.5)
WBC: 17.4 10*3/uL — AB (ref 4.0–10.5)

## 2014-05-17 LAB — COMPREHENSIVE METABOLIC PANEL
ALBUMIN: 3.5 g/dL (ref 3.5–5.2)
ALK PHOS: 58 U/L (ref 39–117)
ALT: 29 U/L (ref 0–53)
ANION GAP: 16 — AB (ref 5–15)
AST: 22 U/L (ref 0–37)
BILIRUBIN TOTAL: 0.6 mg/dL (ref 0.3–1.2)
BUN: 10 mg/dL (ref 6–23)
CHLORIDE: 103 meq/L (ref 96–112)
CO2: 22 mEq/L (ref 19–32)
Calcium: 9.3 mg/dL (ref 8.4–10.5)
Creatinine, Ser: 1.22 mg/dL (ref 0.50–1.35)
GFR calc Af Amer: >90 mL/min (ref >90)
GFR calc non Af Amer: 79 mL/min — ABNORMAL LOW (ref >90)
Glucose, Bld: 129 mg/dL — ABNORMAL HIGH (ref 70–99)
Potassium: 4.1 mEq/L (ref 3.7–5.3)
Sodium: 141 mEq/L (ref 137–147)
Total Protein: 6.3 g/dL (ref 6.0–8.3)

## 2014-05-17 LAB — LIPASE, BLOOD: Lipase: 19 U/L (ref 11–59)

## 2014-05-17 MED ORDER — ONDANSETRON HCL 4 MG/2ML IJ SOLN
4.0000 mg | Freq: Once | INTRAMUSCULAR | Status: AC
  Administered 2014-05-17: 4 mg via INTRAVENOUS
  Filled 2014-05-17: qty 2

## 2014-05-17 MED ORDER — ONDANSETRON HCL 4 MG PO TABS: 4.0000 mg | ORAL_TABLET | Freq: Four times a day (QID) | ORAL | Status: DC

## 2014-05-17 MED ORDER — OMEPRAZOLE 20 MG PO CPDR: 20.0000 mg | DELAYED_RELEASE_CAPSULE | Freq: Every day | ORAL | Status: DC

## 2014-05-17 MED ORDER — SODIUM CHLORIDE 0.9 % IV BOLUS (SEPSIS)
1000.0000 mL | Freq: Once | INTRAVENOUS | Status: AC
  Administered 2014-05-17: 1000 mL via INTRAVENOUS

## 2014-05-17 NOTE — ED Notes (Signed)
Patient states vomiting starting yesterday, states multiple times since yesterday, denies diarrhea,

## 2014-05-17 NOTE — ED Provider Notes (Signed)
CSN: [id number]     Arrival date & time 05/17/14  5784 History   First MD Initiated Contact with Patient 05/17/14 [phone]     Chief Complaint  Patient presents with  . Emesis     (Consider location/radiation/quality/duration/timing/severity/associated sxs/prior Treatment) HPI Comments: Patient with past medical history of gastritis presents emergency department with chief complaint of nausea and vomiting. States that his symptoms started yesterday. He states that he has had persistent vomiting throughout the night. He has not tried taking anything to alleviate his symptoms. There are no aggravating or alleviating factors. Patient denies any abdominal pain. Denies any associated fevers, chills, or diarrhea. He denies any hematemesis. He states that normally when he has gastritis he has left upper quadrant abdominal pain, but he does not have this today. He also reports a history of GERD, but states that he is out of his medications.  The history is provided by the patient. No language interpreter was used.    Past Medical History  Diagnosis Date  . Gastritis    History reviewed. No pertinent past surgical history. No family history on file. History  Substance Use Topics  . Smoking status: Current Every Day Smoker -- 1.00 packs/day    Types: Cigarettes  . Smokeless tobacco: Not on file  . Alcohol Use: Yes     Comment: Daily    Review of Systems  Constitutional: Negative for fever and chills.  Respiratory: Negative for shortness of breath.   Cardiovascular: Negative for chest pain.  Gastrointestinal: Positive for nausea and vomiting. Negative for diarrhea and constipation.  Genitourinary: Negative for dysuria.  All other systems reviewed and are negative.     Allergies  Review of patient's allergies indicates no known allergies.  Home Medications   Prior to Admission medications   Medication Sig Start Date End Date Taking? Authorizing Provider  aspirin-sod bicarb-citric  acid (ALKA-SELTZER) 325 MG TBEF tablet Take 325 mg by mouth every 6 (six) hours as needed (indigestion).    Historical Provider, MD  famotidine (PEPCID) 20 MG tablet Take 20 mg by mouth daily.    Historical Provider, MD  HYDROcodone-acetaminophen (NORCO/VICODIN) 5-325 MG per tablet Take 1-2 tablets by mouth every 6 (six) hours as needed for moderate pain. 02/03/14   Vanetta Mulders, MD  omeprazole (PRILOSEC) 20 MG capsule Take 1 capsule (20 mg total) by mouth daily. 09/02/13   Tatyana A Kirichenko, PA-C  omeprazole (PRILOSEC) 20 MG capsule Take 1 capsule (20 mg total) by mouth daily. 02/03/14   Vanetta Mulders, MD  promethazine (PHENERGAN) 25 MG tablet Take 1 tablet (25 mg total) by mouth every 6 (six) hours as needed for nausea or vomiting. 01/08/14   Kaitlyn Szekalski, PA-C   BP 145/77 mmHg  Pulse 69  Temp(Src) 98.7 F (37.1 C) (Oral)  Resp 20  Ht 5\' 9"  (1.753 m)  Wt 170 lb (77.111 kg)  BMI 25.09 kg/m2  SpO2 99% Physical Exam  Constitutional: He is oriented to person, place, and time. He appears well-developed and well-nourished.  HENT:  Head: Normocephalic and atraumatic.  Eyes: Conjunctivae and EOM are normal. Pupils are equal, round, and reactive to light. Right eye exhibits no discharge. Left eye exhibits no discharge. No scleral icterus.  Neck: Normal range of motion. Neck supple. No JVD present.  Cardiovascular: Normal rate, regular rhythm and normal heart sounds.  Exam reveals no gallop and no friction rub.   No murmur heard. Pulmonary/Chest: Effort normal and breath sounds normal. No respiratory distress. He has no  wheezes. He has no rales. He exhibits no tenderness.  Abdominal: Soft. He exhibits no distension and no mass. There is no tenderness. There is no rebound and no guarding.  No focal abdominal tenderness, no RLQ tenderness or pain at McBurney's point, no RUQ tenderness or Murphy's sign, no left-sided abdominal tenderness, no fluid wave, or signs of peritonitis    Musculoskeletal: Normal range of motion. He exhibits no edema or tenderness.  Neurological: He is alert and oriented to person, place, and time.  Skin: Skin is warm and dry.  Psychiatric: He has a normal mood and affect. His behavior is normal. Judgment and thought content normal.  Nursing note and vitals reviewed.   ED Course  Procedures (including critical care time) Results for orders placed or performed during the hospital encounter of 05/17/14  CBC with Differential  Result Value Ref Range   WBC 17.4 (H) 4.0 - 10.5 K/uL   RBC 5.73 4.22 - 5.81 MIL/uL   Hemoglobin 16.3 13.0 - 17.0 g/dL   HCT 40.945.7 81.139.0 - 91.452.0 %   MCV 79.8 78.0 - 100.0 fL   MCH 28.4 26.0 - 34.0 pg   MCHC 35.7 30.0 - 36.0 g/dL   RDW 78.213.4 95.611.5 - 21.315.5 %   Platelets 345 150 - 400 K/uL   Neutrophils Relative % 93 (H) 43 - 77 %   Neutro Abs 16.1 (H) 1.7 - 7.7 K/uL   Lymphocytes Relative 4 (L) 12 - 46 %   Lymphs Abs 0.7 0.7 - 4.0 K/uL   Monocytes Relative 3 3 - 12 %   Monocytes Absolute 0.6 0.1 - 1.0 K/uL   Eosinophils Relative 0 0 - 5 %   Eosinophils Absolute 0.0 0.0 - 0.7 K/uL   Basophils Relative 0 0 - 1 %   Basophils Absolute 0.0 0.0 - 0.1 K/uL  Comprehensive metabolic panel  Result Value Ref Range   Sodium 141 137 - 147 mEq/L   Potassium 4.1 3.7 - 5.3 mEq/L   Chloride 103 96 - 112 mEq/L   CO2 22 19 - 32 mEq/L   Glucose, Bld 129 (H) 70 - 99 mg/dL   BUN 10 6 - 23 mg/dL   Creatinine, Ser 0.861.22 0.50 - 1.35 mg/dL   Calcium 9.3 8.4 - 57.810.5 mg/dL   Total Protein 6.3 6.0 - 8.3 g/dL   Albumin 3.5 3.5 - 5.2 g/dL   AST 22 0 - 37 U/L   ALT 29 0 - 53 U/L   Alkaline Phosphatase 58 39 - 117 U/L   Total Bilirubin 0.6 0.3 - 1.2 mg/dL   GFR calc non Af Amer 79 (L) >90 mL/min   GFR calc Af Amer >90 >90 mL/min   Anion gap 16 (H) 5 - 15  Lipase, blood  Result Value Ref Range   Lipase 19 11 - 59 U/L   No results found.    EKG Interpretation None      MDM   Final diagnoses:  Nausea and vomiting, vomiting of  unspecified type   Patient with nausea and vomiting since yesterday. He has a history of gastritis. I will give him some fluid, Zofran, and will reassess.   11:54 AM Called to bedside by patient. He states that he is feeling better, and is ready to go home. I feel this is appropriate. Patient does have a nonspecific leukocytosis. He does not have any focal abdominal tenderness. I have assessed his abdomen multiple times during the visit, and he never has complaint of any  abdominal tenderness. He is tolerating oral intake. I have given him specific return precautions and will discharge with some nausea medicine. I also told the patient that I will refill his omeprazole. Patient understands and agrees with the plan. He is stable and ready for discharge.  Patient instructed to return for:  New or worsening symptoms, including, increased abdominal pain, especially pain that localizes to one side, bloody vomit, bloody diarrhea, fever >101, and intractable vomiting.   Roxy Horsemanobert Essie Gehret, PA-C 05/17/14 1157  Joya Gaskinsonald W Wickline, MD 05/17/14 1254

## 2014-05-17 NOTE — ED Notes (Signed)
PA at bedside.

## 2014-05-17 NOTE — Discharge Instructions (Signed)

## 2014-05-17 NOTE — ED Notes (Signed)
Fluids given for po challenge

## 2014-08-05 ENCOUNTER — Emergency Department (HOSPITAL_COMMUNITY)
Admission: EM | Admit: 2014-08-05 | Discharge: 2014-08-05 | Disposition: A | Discharge status: Home / Self Care | Payer: No Typology Code available for payment source | Attending: Emergency Medicine | Admitting: Emergency Medicine

## 2014-08-05 ENCOUNTER — Emergency Department (HOSPITAL_COMMUNITY): Payer: No Typology Code available for payment source

## 2014-08-05 ENCOUNTER — Encounter (HOSPITAL_COMMUNITY): Payer: Self-pay | Admitting: Family Medicine

## 2014-08-05 DIAGNOSIS — S3992XA Unspecified injury of lower back, initial encounter: Secondary | ICD-10-CM | POA: Insufficient documentation

## 2014-08-05 DIAGNOSIS — S8991XA Unspecified injury of right lower leg, initial encounter: Secondary | ICD-10-CM | POA: Insufficient documentation

## 2014-08-05 DIAGNOSIS — S0991XA Unspecified injury of ear, initial encounter: Secondary | ICD-10-CM | POA: Insufficient documentation

## 2014-08-05 DIAGNOSIS — S199XXA Unspecified injury of neck, initial encounter: Secondary | ICD-10-CM | POA: Insufficient documentation

## 2014-08-05 DIAGNOSIS — Y92512 Supermarket, store or market as the place of occurrence of the external cause: Secondary | ICD-10-CM | POA: Insufficient documentation

## 2014-08-05 DIAGNOSIS — Y9389 Activity, other specified: Secondary | ICD-10-CM | POA: Diagnosis not present

## 2014-08-05 DIAGNOSIS — Z720 Tobacco use: Secondary | ICD-10-CM | POA: Insufficient documentation

## 2014-08-05 DIAGNOSIS — S0033XA Contusion of nose, initial encounter: Secondary | ICD-10-CM | POA: Diagnosis not present

## 2014-08-05 DIAGNOSIS — S0990XA Unspecified injury of head, initial encounter: Secondary | ICD-10-CM | POA: Insufficient documentation

## 2014-08-05 DIAGNOSIS — Z79899 Other long term (current) drug therapy: Secondary | ICD-10-CM | POA: Insufficient documentation

## 2014-08-05 DIAGNOSIS — R52 Pain, unspecified: Secondary | ICD-10-CM

## 2014-08-05 DIAGNOSIS — Z8719 Personal history of other diseases of the digestive system: Secondary | ICD-10-CM | POA: Diagnosis not present

## 2014-08-05 DIAGNOSIS — S301XXA Contusion of abdominal wall, initial encounter: Secondary | ICD-10-CM | POA: Insufficient documentation

## 2014-08-05 DIAGNOSIS — S0993XA Unspecified injury of face, initial encounter: Secondary | ICD-10-CM | POA: Diagnosis present

## 2014-08-05 DIAGNOSIS — Y998 Other external cause status: Secondary | ICD-10-CM | POA: Diagnosis not present

## 2014-08-05 LAB — CBC
HCT: 43.5 % (ref 39.0–52.0)
Hemoglobin: 15.3 g/dL (ref 13.0–17.0)
MCH: 29 pg (ref 26.0–34.0)
MCHC: 35.2 g/dL (ref 30.0–36.0)
MCV: 82.4 fL (ref 78.0–100.0)
PLATELETS: 331 10*3/uL (ref 150–400)
RBC: 5.28 MIL/uL (ref 4.22–5.81)
RDW: 13.3 % (ref 11.5–15.5)
WBC: 13 10*3/uL — ABNORMAL HIGH (ref 4.0–10.5)

## 2014-08-05 LAB — I-STAT CHEM 8, ED
BUN: 10 mg/dL (ref 6–23)
Calcium, Ion: 1.18 mmol/L (ref 1.12–1.23)
Chloride: 100 mmol/L (ref 96–112)
Creatinine, Ser: 1.3 mg/dL (ref 0.50–1.35)
Glucose, Bld: 90 mg/dL (ref 70–99)
HEMATOCRIT: 48 % (ref 39.0–52.0)
Hemoglobin: 16.3 g/dL (ref 13.0–17.0)
POTASSIUM: 4.2 mmol/L (ref 3.5–5.1)
Sodium: 139 mmol/L (ref 135–145)
TCO2: 25 mmol/L (ref 0–100)

## 2014-08-05 LAB — COMPREHENSIVE METABOLIC PANEL
ALK PHOS: 60 U/L (ref 39–117)
ALT: 33 U/L (ref 0–53)
AST: 50 U/L — AB (ref 0–37)
Albumin: 3.3 g/dL — ABNORMAL LOW (ref 3.5–5.2)
Anion gap: 4 — ABNORMAL LOW (ref 5–15)
BUN: 9 mg/dL (ref 6–23)
CO2: 29 mmol/L (ref 19–32)
Calcium: 9 mg/dL (ref 8.4–10.5)
Chloride: 104 mmol/L (ref 96–112)
Creatinine, Ser: 1.34 mg/dL (ref 0.50–1.35)
GFR calc non Af Amer: 70 mL/min — ABNORMAL LOW (ref >90)
GFR, EST AFRICAN AMERICAN: 82 mL/min — AB (ref >90)
GLUCOSE: 91 mg/dL (ref 70–99)
Potassium: 4.2 mmol/L (ref 3.5–5.1)
SODIUM: 137 mmol/L (ref 135–145)
Total Bilirubin: 1.2 mg/dL (ref 0.3–1.2)
Total Protein: 5.5 g/dL — ABNORMAL LOW (ref 6.0–8.3)

## 2014-08-05 LAB — URINALYSIS, ROUTINE W REFLEX MICROSCOPIC
Bilirubin Urine: NEGATIVE
GLUCOSE, UA: NEGATIVE mg/dL
Hgb urine dipstick: NEGATIVE
KETONES UR: NEGATIVE mg/dL
LEUKOCYTES UA: NEGATIVE
NITRITE: NEGATIVE
PROTEIN: NEGATIVE mg/dL
Specific Gravity, Urine: 1.019 (ref 1.005–1.030)
UROBILINOGEN UA: 0.2 mg/dL (ref 0.0–1.0)
pH: 6 (ref 5.0–8.0)

## 2014-08-05 LAB — PROTIME-INR
INR: 1.11 (ref 0.00–1.49)
Prothrombin Time: 14.4 seconds (ref 11.6–15.2)

## 2014-08-05 MED ORDER — HYDROCODONE-ACETAMINOPHEN 5-325 MG PO TABS
2.0000 | ORAL_TABLET | Freq: Once | ORAL | Status: AC
  Administered 2014-08-05: 2 via ORAL
  Filled 2014-08-05: qty 2

## 2014-08-05 MED ORDER — MORPHINE SULFATE 4 MG/ML IJ SOLN
4.0000 mg | Freq: Once | INTRAMUSCULAR | Status: AC
  Administered 2014-08-05: 4 mg via INTRAVENOUS
  Filled 2014-08-05: qty 1

## 2014-08-05 MED ORDER — OXYCODONE-ACETAMINOPHEN 10-325 MG PO TABS: 1.0000 | ORAL_TABLET | ORAL | Status: DC | PRN

## 2014-08-05 MED ORDER — METHOCARBAMOL 500 MG PO TABS: 500.0000 mg | ORAL_TABLET | Freq: Two times a day (BID) | ORAL | Status: DC

## 2014-08-05 MED ORDER — NAPROXEN 500 MG PO TABS: 500.0000 mg | ORAL_TABLET | Freq: Two times a day (BID) | ORAL | Status: DC

## 2014-08-05 MED ORDER — IOHEXOL 300 MG/ML  SOLN
100.0000 mL | Freq: Once | INTRAMUSCULAR | Status: AC | PRN
  Administered 2014-08-05: 100 mL via INTRAVENOUS
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
## 2014-08-05 NOTE — ED Notes (Signed)
Patient transported to CT 
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
## 2014-08-05 NOTE — Discharge Instructions (Signed)
1. Medications: robaxin, naproxyn, vicodin, usual home medications °2. Treatment: rest, drink plenty of fluids, gentle stretching as discussed, alternate ice and heat °3. Follow Up: Please followup with your primary doctor in 3 days for discussion of your diagnoses and further evaluation after today's visit; if you do not have a primary care doctor use the resource guide provided to find one;  Return to the ER for worsening back pain, difficulty walking, loss of bowel or bladder control or other concerning symptoms ° ° ° ° °Emergency Department Resource Guide °1) Find a Doctor and Pay Out of Pocket °Although you won't have to find out who is covered by your insurance plan, it is a good idea to ask around and get recommendations. You will then need to call the office and see if the doctor you have chosen will accept you as a new patient and what types of options they offer for patients who are self-pay. Some doctors offer discounts or will set up payment plans for their patients who do not have insurance, but you will need to ask so you aren't surprised when you get to your appointment. ° °2) Contact Your Local Health Department °Not all health departments have doctors that can see patients for sick visits, but many do, so it is worth a call to see if yours does. If you don't know where your local health department is, you can check in your phone book. The CDC also has a tool to help you locate your state's health department, and many state websites also have listings of all of their local health departments. ° °3) Find a Walk-in Clinic °If your illness is not likely to be very severe or complicated, you may want to try a walk in clinic. These are popping up all over the country in pharmacies, drugstores, and shopping centers. They're usually staffed by nurse practitioners or physician assistants that have been trained to treat common illnesses and complaints. They're usually fairly quick and inexpensive. However, if  you have serious medical issues or chronic medical problems, these are probably not your best option. ° °No Primary Care Doctor: °- Call Health Connect at  [phone] - they can help you locate a primary care doctor that  accepts your insurance, provides certain services, etc. °- Physician Referral Service- [phone] ° °Chronic Pain Problems: °Organization         Address  Phone   Notes  °[facility]  [phone] Patients need to be referred by their primary care doctor.  ° °Medication Assistance: °Organization         Address  Phone   Notes  °[place] [facility] [address] °[place], [place] [address] [phone] --Must be a resident of [place] °-- Must have NO insurance coverage whatsoever (no Medicaid/ Medicare, etc.) °-- The pt. MUST have a primary care doctor that directs their care regularly and follows them in the community °  °MedAssist  (866) [phone]   °[employer]  (888) 892-1162   ° °Agencies that provide inexpensive medical care: °Organization         Address  Phone   Notes  °[facility]  ([phone]   °[facility]    ([phone]) [phone]   °[facility] [address] °[place], [place] [address] [phone]   °[facility] [address], °[place] ([phone]) [phone]   °Planned Parenthood    ([phone]) 373-0678   °  [facility]    [phone]   [facility]  [address], [place] Phone:  [phone], Fax:  [phone] Hours of Operation:  9 am - 6 pm, M-F.  Also accepts Medicaid/Medicare and self-pay.  [facility] for [address], [address], [place] Phone: [phone], Fax: [phone]. Hours of Operation:  8:30 am - 5:30 pm, M-F.  Also accepts Medicaid and self-pay.  [facility] [place] [address] [place] Phone: [phone]   [place], [place], [place] [phone], Ext. [phone] Mondays & Thursdays: 7-9 AM.  First 15 patients are seen on a first come, first serve basis.    [place] Providers:  Organization         Address  Phone   Notes  [facility] [address], [place] [phone] Also accepts self-pay patients.  [facility] [address], [place]  [phone]   [place], [address], [place] [phone]   [facility] [address] [place] [phone]   Lucianne Lei [address], [place]   [phone] Only accepts [place] Access [place] patients after they have their name applied to their card.   Self-Pay (no insurance) in [facility]:  Organization         Address  Phone   Notes  Sickle Cell Patients, [facility] [place] [phone]   [facility] [place] [phone]   [facility] [place]  [place], [place], [place] [phone]   [facility]/Dr. Osei-Bonsu  [address] [place] or [address], [place] [phone] Phone number for both [place] and [place] locations is the same.  Urgent Medical and [facility] [address] [place] [phone]   [facility] [address] [place] or [address] [phone]   [facility] [address], [place] [phone], phone; [phone], fax Sees patients 1st and 3rd Saturday of every month.  Must not qualify for public or private insurance (i.e. Medicaid, Medicare, [place] Health Choice, Veterans' Benefits)  Household income should be no more than 200% of the poverty level The clinic cannot treat you if you are pregnant or think you are  pregnant  Sexually transmitted diseases are not treated at the clinic.    Dental Care: Organization         Address  Phone  Notes  [facility] [phone] Accepts children up to age 24 who are enrolled in [place] or [place]; pregnant women with a Medicaid card; and children who have applied for Medicaid or [place] Health Choice, but were declined, whose parents can pay a reduced fee at time of service.  [facility] of [facility]  [address], [place] [phone] Accepts children up to age 69 who are enrolled in [place] or [place]; pregnant women with a Medicaid card; and children who have applied for Medicaid or [place] Health Choice, but were declined, whose parents can pay a reduced fee at time  of service.  [facility]  [place] [phone] Patients are seen by appointment only. Walk-ins are not accepted. [place] will see patients 2 years of age and older. Monday - Tuesday (8am-5pm) Most Wednesdays (8:30-5pm) $30 per visit, cash only  [facility] Adult [facility] Access PRO[facility]  [address], [facility] [phone] Patients are seen by appointment only. Walk-ins are not accepted. [place] will see patients 82 years of age and older. One Wednesday Evening (Monthly: Volunteer Based).  $30 per visit, cash only  [place]  [phone] for adults; Children under age 14, call Graduate Pediatric Dentistry at [phone]. Children aged 77-14, please call [phone] to request a pediatric application.  Dental services are provided in all areas of dental care including fillings, crowns and bridges, complete and partial dentures, implants, gum treatment, root canals, and extractions. Preventive care is also provided. Treatment is provided to both adults and  children. Patients are selected via a lottery and there is often a waiting list.   [facility] [address] [place]  [phone] [email or web address]   [facility] [address] [place], [place] [phone], Ext. [phone] Second and Fourth Thursday of each month, opens at 6:30 AM; Clinic ends at 9 AM.  Patients are seen on a first-come first-served basis, and a limited number are seen during each clinic.   [facility]  [address] Hillard Danker [place], [place] [phone]   Eligibility Requirements You must have lived in [place], [place], or [place] counties for at least the last three months.   You cannot be eligible for state or federal sponsored [employer], including [employer], [place], or [employer].   You generally cannot be eligible for healthcare insurance through your employer.    How to apply: Eligibility screenings are held every Tuesday and Wednesday afternoon from 1:00 pm until 4:00 pm. You do not need an appointment for the interview!  [facility] [address] [place], [place]   [place]  [place] Department  [place]  [phone]    Behavioral Health Resources in the Community: Intensive Outpatient Programs Organization         Address  Phone  Notes  [place]. [address] [place], [place] [phone]   [facility] [address] [place], [place]   ADS: Alcohol & Drug Svcs [address], [place], [place]   [place] [address],  [place], [place] or [phone]   Substance Abuse Resources Organization         Address  Phone  Notes  Alcohol and Drug Services  [phone]   [place]  [phone]   The [place]    Chinita Pester  [phone]   Residential & Outpatient Substance Abuse Program  [phone]   Psychological Services Organization         Address  Phone  Notes  [facility] [place]  [place]  [phone]   [place] [address] [place] [phone] or [phone]    Mobile Crisis Teams Organization         Address  Phone  Notes  [facility], Mobile Crisis Care Unit  [phone]   Assertive Psychotherapeutic  Services  [address] [place], [place]   [facility] [address] [place] [phone]    Self-Help/Support Groups Organization         Address  Phone             Notes  [place]. of [place] - variety of support groups  [place] Call for more information  Narcotics Anonymous (NA), [facility] [address], [employer] [place]  2 meetings at this location   Special educational needs teacher         Address  Phone  Notes  [facility] [place],    [place]  [phone]   [facility]  [address] [place] [phone], [place], [place]   [place], [place] [phone] Admissions: 8am-3pm M-F  Incentives Substance [place] [address]    [place], [place] [phone]   [facility] [address] [place], [place], [place]   [facility] [address]  [place], [place]   [facility] - Intensive Outpatient [address], [place], [place]   [facility] ([place].,  [place], [place] [phone] or [phone]   [facility] (RTS) [address], [place], [place] Accepts Medicaid  [facility] [address],  [place] [phone] Substance Abuse/Addiction Treatment   [facility] Organization         Address  Phone  Notes  [facility]  [phone]   Domenic Schwab, PhD [address] Arlis Port[place], [place]   [phone] or [phone]   [place], [place] [phone]   [facility] [phone], [place], [place] [phone] Insurance/Medicaid/sponsorship through [facility] and Families [address], [address] [place]                                    [place], [place] [phone] [place] [address], [place] [phone]    Dr. Adele Schilder  [phone]   [facility] [employer] Dept. 1) [address], [place] 2) [place] 3)  [place] [address], [place] [phone]  [phone]   [place] [phone] or [phone] (After Hours)

## 2014-08-08 ENCOUNTER — Encounter (HOSPITAL_COMMUNITY): Payer: Self-pay | Admitting: *Deleted

## 2014-08-08 ENCOUNTER — Emergency Department (HOSPITAL_COMMUNITY)
Admission: EM | Admit: 2014-08-08 | Discharge: 2014-08-08 | Disposition: A | Discharge status: Home / Self Care | Payer: Self-pay | Attending: Emergency Medicine | Admitting: Emergency Medicine

## 2014-08-08 ENCOUNTER — Emergency Department (HOSPITAL_COMMUNITY): Payer: Self-pay

## 2014-08-08 DIAGNOSIS — Z79899 Other long term (current) drug therapy: Secondary | ICD-10-CM | POA: Insufficient documentation

## 2014-08-08 DIAGNOSIS — S199XXD Unspecified injury of neck, subsequent encounter: Secondary | ICD-10-CM | POA: Insufficient documentation

## 2014-08-08 DIAGNOSIS — S299XXD Unspecified injury of thorax, subsequent encounter: Secondary | ICD-10-CM | POA: Insufficient documentation

## 2014-08-08 DIAGNOSIS — Z791 Long term (current) use of non-steroidal anti-inflammatories (NSAID): Secondary | ICD-10-CM | POA: Insufficient documentation

## 2014-08-08 DIAGNOSIS — S0993XD Unspecified injury of face, subsequent encounter: Secondary | ICD-10-CM | POA: Insufficient documentation

## 2014-08-08 DIAGNOSIS — S8991XD Unspecified injury of right lower leg, subsequent encounter: Secondary | ICD-10-CM | POA: Insufficient documentation

## 2014-08-08 DIAGNOSIS — Z720 Tobacco use: Secondary | ICD-10-CM | POA: Insufficient documentation

## 2014-08-08 DIAGNOSIS — Z8719 Personal history of other diseases of the digestive system: Secondary | ICD-10-CM | POA: Insufficient documentation

## 2014-08-08 DIAGNOSIS — S4991XD Unspecified injury of right shoulder and upper arm, subsequent encounter: Secondary | ICD-10-CM | POA: Insufficient documentation

## 2014-08-08 MED ORDER — METHOCARBAMOL 500 MG PO TABS: 500.0000 mg | ORAL_TABLET | Freq: Two times a day (BID) | ORAL | Status: DC

## 2014-08-08 MED ORDER — OXYCODONE-ACETAMINOPHEN 10-325 MG PO TABS: 1.0000 | ORAL_TABLET | ORAL | Status: DC | PRN
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
## 2014-08-08 NOTE — ED Notes (Signed)
Patient transported to CT 
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
## 2015-03-25 ENCOUNTER — Encounter (HOSPITAL_COMMUNITY): Payer: Self-pay | Admitting: Emergency Medicine

## 2015-03-25 ENCOUNTER — Emergency Department (HOSPITAL_COMMUNITY)
Admission: EM | Admit: 2015-03-25 | Discharge: 2015-03-25 | Disposition: A | Discharge status: Home / Self Care | Payer: Self-pay | Attending: Emergency Medicine | Admitting: Emergency Medicine

## 2015-03-25 DIAGNOSIS — Z79899 Other long term (current) drug therapy: Secondary | ICD-10-CM | POA: Insufficient documentation

## 2015-03-25 DIAGNOSIS — Z720 Tobacco use: Secondary | ICD-10-CM | POA: Insufficient documentation

## 2015-03-25 DIAGNOSIS — R112 Nausea with vomiting, unspecified: Secondary | ICD-10-CM | POA: Insufficient documentation

## 2015-03-25 LAB — COMPREHENSIVE METABOLIC PANEL
ALBUMIN: 3.7 g/dL (ref 3.5–5.0)
ALT: 21 U/L (ref 17–63)
AST: 23 U/L (ref 15–41)
Alkaline Phosphatase: 52 U/L (ref 38–126)
Anion gap: 13 (ref 5–15)
BUN: 11 mg/dL (ref 6–20)
CO2: 25 mmol/L (ref 22–32)
Calcium: 9.7 mg/dL (ref 8.9–10.3)
Chloride: 101 mmol/L (ref 101–111)
Creatinine, Ser: 1.52 mg/dL — ABNORMAL HIGH (ref 0.61–1.24)
GFR calc Af Amer: >60 mL/min (ref >60)
GFR calc non Af Amer: >60 mL/min (ref >60)
GLUCOSE: 112 mg/dL — AB (ref 65–99)
POTASSIUM: 3.6 mmol/L (ref 3.5–5.1)
SODIUM: 139 mmol/L (ref 135–145)
Total Bilirubin: 0.9 mg/dL (ref 0.3–1.2)
Total Protein: 6 g/dL — ABNORMAL LOW (ref 6.5–8.1)

## 2015-03-25 LAB — CBC WITH DIFFERENTIAL/PLATELET
BASOS ABS: 0 10*3/uL (ref 0.0–0.1)
BASOS PCT: 0 %
EOS ABS: 0 10*3/uL (ref 0.0–0.7)
Eosinophils Relative: 0 %
HEMATOCRIT: 47.5 % (ref 39.0–52.0)
Hemoglobin: 17 g/dL (ref 13.0–17.0)
Lymphocytes Relative: 7 %
Lymphs Abs: 1.4 10*3/uL (ref 0.7–4.0)
MCH: 28.5 pg (ref 26.0–34.0)
MCHC: 35.8 g/dL (ref 30.0–36.0)
MCV: 79.6 fL (ref 78.0–100.0)
MONO ABS: 1.6 10*3/uL — AB (ref 0.1–1.0)
Monocytes Relative: 8 %
NEUTROS ABS: 17.8 10*3/uL — AB (ref 1.7–7.7)
Neutrophils Relative %: 85 %
Platelets: 406 10*3/uL — ABNORMAL HIGH (ref 150–400)
RBC: 5.97 MIL/uL — ABNORMAL HIGH (ref 4.22–5.81)
RDW: 13.6 % (ref 11.5–15.5)
WBC: 20.8 10*3/uL — ABNORMAL HIGH (ref 4.0–10.5)

## 2015-03-25 LAB — LIPASE, BLOOD: Lipase: 24 U/L (ref 22–51)

## 2015-03-25 MED ORDER — SODIUM CHLORIDE 0.9 % IV BOLUS (SEPSIS)
1000.0000 mL | Freq: Once | INTRAVENOUS | Status: AC
  Administered 2015-03-25: 1000 mL via INTRAVENOUS

## 2015-03-25 MED ORDER — ONDANSETRON 4 MG PO TBDP: 4.0000 mg | ORAL_TABLET | Freq: Three times a day (TID) | ORAL | Status: DC | PRN

## 2015-03-25 MED ORDER — LIDOCAINE VISCOUS 2 % MT SOLN
15.0000 mL | Freq: Once | OROMUCOSAL | Status: AC
Start: 2015-03-25 — End: 2015-03-25
  Administered 2015-03-25: 15 mL via OROMUCOSAL
  Filled 2015-03-25: qty 15

## 2015-03-25 MED ORDER — ALUM & MAG HYDROXIDE-SIMETH 200-200-20 MG/5ML PO SUSP
15.0000 mL | Freq: Once | ORAL | Status: AC
  Administered 2015-03-25: 15 mL via ORAL
  Filled 2015-03-25: qty 30

## 2015-03-25 NOTE — ED Notes (Addendum)
Nausea and vomiting starting yesterday; denies diarr; cant keep anything down. Denies pain in belly.

## 2015-03-25 NOTE — ED Provider Notes (Signed)
CSN: [id number]     Arrival date & time 03/25/15  1021 History   First MD Initiated Contact with Patient 03/25/15 1024     Chief Complaint  Patient presents with  . Emesis     (Consider location/radiation/quality/duration/timing/severity/associated sxs/prior Treatment) Patient is a 29 y.o. male presenting with vomiting and general illness. The history is provided by the patient.  Emesis Associated symptoms: no abdominal pain, no arthralgias, no chills, no diarrhea, no headaches and no myalgias   Illness Severity:  Moderate Onset quality:  Gradual Duration:  2 days Timing:  Constant Progression:  Worsening Chronicity:  New Associated symptoms: nausea and vomiting   Associated symptoms: no abdominal pain, no chest pain, no congestion, no diarrhea, no fever, no headaches, no myalgias, no rash and no shortness of breath     29 yo M  With a chief complaint of vomiting. Patient states that he drank something a couple days ago and since then has been unable to keep anything down. Patient has a history of recurrent episodes of this in the past. No noted etiologies. Patient denies any abdominal pain denies any chest pain denies trauma. Patient denies fevers or chills.  Past Medical History  Diagnosis Date  . Gastritis    History reviewed. No pertinent past surgical history. History reviewed. No pertinent family history. Social History  Substance Use Topics  . Smoking status: Current Every Day Smoker -- 1.00 packs/day    Types: Cigarettes  . Smokeless tobacco: None  . Alcohol Use: Yes     Comment: Daily    Review of Systems  Constitutional: Negative for fever and chills.  HENT: Negative for congestion and facial swelling.   Eyes: Negative for discharge and visual disturbance.  Respiratory: Negative for shortness of breath.   Cardiovascular: Negative for chest pain and palpitations.  Gastrointestinal: Positive for nausea and vomiting. Negative for abdominal pain and diarrhea.   Musculoskeletal: Negative for myalgias and arthralgias.  Skin: Negative for color change and rash.  Neurological: Negative for tremors, syncope and headaches.  Psychiatric/Behavioral: Negative for confusion and dysphoric mood.      Allergies  Review of patient's allergies indicates no known allergies.  Home Medications   Prior to Admission medications   Medication Sig Start Date End Date Taking? Authorizing Provider  methocarbamol (ROBAXIN) 500 MG tablet Take 1 tablet (500 mg total) by mouth 2 (two) times daily. 08/08/14   Fayrene Helper, PA-C  naproxen (NAPROSYN) 500 MG tablet Take 1 tablet (500 mg total) by mouth 2 (two) times daily with a meal. 08/05/14   Hannah Muthersbaugh, PA-C  ondansetron (ZOFRAN ODT) 4 MG disintegrating tablet Take 1 tablet (4 mg total) by mouth every 8 (eight) hours as needed for nausea or vomiting. 03/25/15   Melene Plan, DO  oxyCODONE-acetaminophen (PERCOCET) 10-325 MG per tablet Take 1 tablet by mouth every 4 (four) hours as needed for pain. 08/08/14   Fayrene Helper, PA-C   BP 162/94 mmHg  Pulse 63  Temp(Src) 98.4 F (36.9 C)  Resp 16  SpO2 97% Physical Exam  Constitutional: He is oriented to person, place, and time. He appears well-developed and well-nourished.  HENT:  Head: Normocephalic and atraumatic.  Eyes: EOM are normal. Pupils are equal, round, and reactive to light.  Neck: Normal range of motion. Neck supple. No JVD present.  Cardiovascular: Normal rate and regular rhythm.  Exam reveals no gallop and no friction rub.   No murmur heard. Pulmonary/Chest: No respiratory distress. He has no wheezes.  Abdominal:  He exhibits no distension. There is no tenderness. There is no rebound and no guarding.  Musculoskeletal: Normal range of motion.  Neurological: He is alert and oriented to person, place, and time.  Skin: No rash noted. No pallor.  Psychiatric: He has a normal mood and affect. His behavior is normal.  Nursing note and vitals reviewed.   ED  Course  Procedures (including critical care time) Labs Review Labs Reviewed  CBC WITH DIFFERENTIAL/PLATELET - Abnormal; Notable for the following:    WBC 20.8 (*)    RBC 5.97 (*)    Platelets 406 (*)    Neutro Abs 17.8 (*)    Monocytes Absolute 1.6 (*)    All other components within normal limits  COMPREHENSIVE METABOLIC PANEL - Abnormal; Notable for the following:    Glucose, Bld 112 (*)    Creatinine, Ser 1.52 (*)    Total Protein 6.0 (*)    All other components within normal limits  LIPASE, BLOOD    Imaging Review No results found. I have personally reviewed and evaluated these images and lab results as part of my medical decision-making.   EKG Interpretation None      MDM   Final diagnoses:  Non-intractable vomiting with nausea, vomiting of unspecified type    29 yo M  With a chief complaint nausea and vomiting. Patient with mild dehydration noted on laboratory evaluation. Significant leukocytosis. Patient not showing any other signs of infection. With no noted abdominal pain and see no need for imaging at this time.  Discuss results with patient , will have him follow-up with the PCP. Referred to new PCP,  Feel he may likely need a GI follow-up for recurrent episodes of this.  Zofran prescription.  11:52 AM:  I have discussed the diagnosis/risks/treatment options with the patient and family and believe the pt to be eligible for discharge home to follow-up with PCP/GI. We also discussed returning to the ED immediately if new or worsening sx occur. We discussed the sx which are most concerning (e.g., sudden worsening pain, fever, inability to tolerate by mouth) that necessitate immediate return. Medications administered to the patient during their visit and any new prescriptions provided to the patient are listed below.  Medications given during this visit Medications  sodium chloride 0.9 % bolus 1,000 mL (1,000 mLs Intravenous New Bag/Given 03/25/15 1058)  alum & mag  hydroxide-simeth (MAALOX/MYLANTA) 200-200-20 MG/5ML suspension 15 mL (15 mLs Oral Given 03/25/15 1058)  lidocaine (XYLOCAINE) 2 % viscous mouth solution 15 mL (15 mLs Mouth/Throat Given 03/25/15 1058)    New Prescriptions   ONDANSETRON (ZOFRAN ODT) 4 MG DISINTEGRATING TABLET    Take 1 tablet (4 mg total) by mouth every 8 (eight) hours as needed for nausea or vomiting.    The patient appears reasonably screen and/or stabilized for discharge and I doubt any other medical condition or other [facility] requiring further screening, evaluation, or treatment in the ED at this time prior to discharge.      Melene Planan Gertrude Tarbet, DO 03/25/15 1152

## 2015-03-25 NOTE — ED Notes (Signed)
NAD at this time. Pt is stable and going home.  

## 2015-03-25 NOTE — Discharge Instructions (Signed)
Take Zantac twice a day Nausea and Vomiting Nausea is a sick feeling that often comes before throwing up (vomiting). Vomiting is a reflex where stomach contents come out of your mouth. Vomiting can cause severe loss of body fluids (dehydration). Children and elderly adults can become dehydrated quickly, especially if they also have diarrhea. Nausea and vomiting are symptoms of a condition or disease. It is important to find the cause of your symptoms. CAUSES   Direct irritation of the stomach lining. This irritation can result from increased acid production (gastroesophageal reflux disease), infection, food poisoning, taking certain medicines (such as nonsteroidal anti-inflammatory drugs), alcohol use, or tobacco use.  Signals from the brain.These signals could be caused by a headache, heat exposure, an inner ear disturbance, increased pressure in the brain from injury, infection, a tumor, or a concussion, pain, emotional stimulus, or metabolic problems.  An obstruction in the gastrointestinal tract (bowel obstruction).  Illnesses such as diabetes, hepatitis, gallbladder problems, appendicitis, kidney problems, cancer, sepsis, atypical symptoms of a heart attack, or eating disorders.  Medical treatments such as chemotherapy and radiation.  Receiving medicine that makes you sleep (general anesthetic) during surgery. DIAGNOSIS Your caregiver may ask for tests to be done if the problems do not improve after a few days. Tests may also be done if symptoms are severe or if the reason for the nausea and vomiting is not clear. Tests may include:  Urine tests.  Blood tests.  Stool tests.  Cultures (to look for evidence of infection).  X-rays or other imaging studies. Test results can help your caregiver make decisions about treatment or the need for additional tests. TREATMENT You need to stay well hydrated. Drink frequently but in small amounts.You may wish to drink water, sports drinks,  clear broth, or eat frozen ice pops or gelatin dessert to help stay hydrated.When you eat, eating slowly may help prevent nausea.There are also some antinausea medicines that may help prevent nausea. HOME CARE INSTRUCTIONS   Take all medicine as directed by your caregiver.  If you do not have an appetite, do not force yourself to eat. However, you must continue to drink fluids.  If you have an appetite, eat a normal diet unless your caregiver tells you differently.  Eat a variety of complex carbohydrates (rice, wheat, potatoes, bread), lean meats, yogurt, fruits, and vegetables.  Avoid high-fat foods because they are more difficult to digest.  Drink enough water and fluids to keep your urine clear or pale yellow.  If you are dehydrated, ask your caregiver for specific rehydration instructions. Signs of dehydration may include:  Severe thirst.  Dry lips and mouth.  Dizziness.  Dark urine.  Decreasing urine frequency and amount.  Confusion.  Rapid breathing or pulse. SEEK IMMEDIATE MEDICAL CARE IF:   You have blood or brown flecks (like coffee grounds) in your vomit.  You have black or bloody stools.  You have a severe headache or stiff neck.  You are confused.  You have severe abdominal pain.  You have chest pain or trouble breathing.  You do not urinate at least once every 8 hours.  You develop cold or clammy skin.  You continue to vomit for longer than 24 to 48 hours.  You have a fever. MAKE SURE YOU:   Understand these instructions.  Will watch your condition.  Will get help right away if you are not doing well or get worse.   This information is not intended to replace advice given to you by your  health care provider. Make sure you discuss any questions you have with your health care provider.   Document Released: 05/24/2005 Document Revised: 08/16/2011 Document Reviewed: 10/21/2010 Elsevier Interactive Patient Education [employer].

## 2015-05-11 ENCOUNTER — Encounter (HOSPITAL_COMMUNITY): Payer: Self-pay | Admitting: Emergency Medicine

## 2015-05-11 ENCOUNTER — Emergency Department (HOSPITAL_COMMUNITY)
Admission: EM | Admit: 2015-05-11 | Discharge: 2015-05-11 | Disposition: A | Discharge status: Home / Self Care | Payer: Self-pay | Attending: Physician Assistant | Admitting: Physician Assistant

## 2015-05-11 DIAGNOSIS — A084 Viral intestinal infection, unspecified: Secondary | ICD-10-CM | POA: Insufficient documentation

## 2015-05-11 DIAGNOSIS — F17210 Nicotine dependence, cigarettes, uncomplicated: Secondary | ICD-10-CM | POA: Insufficient documentation

## 2015-05-11 DIAGNOSIS — Z8719 Personal history of other diseases of the digestive system: Secondary | ICD-10-CM | POA: Insufficient documentation

## 2015-05-11 LAB — URINALYSIS, ROUTINE W REFLEX MICROSCOPIC
BILIRUBIN URINE: NEGATIVE
Glucose, UA: NEGATIVE mg/dL
Hgb urine dipstick: NEGATIVE
Ketones, ur: 15 mg/dL — AB
LEUKOCYTES UA: NEGATIVE
Nitrite: NEGATIVE
Protein, ur: 30 mg/dL — AB
SPECIFIC GRAVITY, URINE: 1.019 (ref 1.005–1.030)
pH: 8.5 — ABNORMAL HIGH (ref 5.0–8.0)

## 2015-05-11 LAB — COMPREHENSIVE METABOLIC PANEL
ALT: 26 U/L (ref 17–63)
ANION GAP: 10 (ref 5–15)
AST: 26 U/L (ref 15–41)
Albumin: 3.8 g/dL (ref 3.5–5.0)
Alkaline Phosphatase: 53 U/L (ref 38–126)
BILIRUBIN TOTAL: 0.9 mg/dL (ref 0.3–1.2)
BUN: 10 mg/dL (ref 6–20)
CO2: 24 mmol/L (ref 22–32)
Calcium: 9.3 mg/dL (ref 8.9–10.3)
Chloride: 104 mmol/L (ref 101–111)
Creatinine, Ser: 1.25 mg/dL — ABNORMAL HIGH (ref 0.61–1.24)
GFR calc non Af Amer: >60 mL/min (ref >60)
GLUCOSE: 137 mg/dL — AB (ref 65–99)
Potassium: 3.7 mmol/L (ref 3.5–5.1)
Sodium: 138 mmol/L (ref 135–145)
TOTAL PROTEIN: 6.5 g/dL (ref 6.5–8.1)

## 2015-05-11 LAB — CBC
HCT: 45.7 % (ref 39.0–52.0)
HEMOGLOBIN: 16.1 g/dL (ref 13.0–17.0)
MCH: 28.9 pg (ref 26.0–34.0)
MCHC: 35.2 g/dL (ref 30.0–36.0)
MCV: 82 fL (ref 78.0–100.0)
Platelets: 374 10*3/uL (ref 150–400)
RBC: 5.57 MIL/uL (ref 4.22–5.81)
RDW: 13.2 % (ref 11.5–15.5)
WBC: 12.8 10*3/uL — AB (ref 4.0–10.5)

## 2015-05-11 LAB — LIPASE, BLOOD: Lipase: 33 U/L (ref 11–51)

## 2015-05-11 LAB — URINE MICROSCOPIC-ADD ON
RBC / HPF: NONE SEEN RBC/hpf (ref 0–5)
SQUAMOUS EPITHELIAL / LPF: NONE SEEN
WBC UA: NONE SEEN WBC/hpf (ref 0–5)

## 2015-05-11 MED ORDER — SODIUM CHLORIDE 0.9 % IV BOLUS (SEPSIS)
1000.0000 mL | Freq: Once | INTRAVENOUS | Status: AC
  Administered 2015-05-11: 1000 mL via INTRAVENOUS

## 2015-05-11 MED ORDER — ONDANSETRON 4 MG PO TBDP: 4.0000 mg | ORAL_TABLET | Freq: Three times a day (TID) | ORAL | Status: DC | PRN

## 2015-05-11 MED ORDER — GI COCKTAIL ~~LOC~~
30.0000 mL | Freq: Once | ORAL | Status: AC
  Administered 2015-05-11: 30 mL via ORAL
  Filled 2015-05-11: qty 30

## 2015-05-11 MED ORDER — ONDANSETRON HCL 4 MG/2ML IJ SOLN
4.0000 mg | Freq: Once | INTRAMUSCULAR | Status: AC
  Administered 2015-05-11: 4 mg via INTRAVENOUS
  Filled 2015-05-11: qty 2

## 2015-05-11 NOTE — Discharge Instructions (Signed)
Take your medications as prescribed. Also continue drinking lots of fluids to remain hydrated. Please follow up with a primary care provider from the Resource Guide provided below in 4-5 days. Please return to the Emergency Department if symptoms worsen or new onset of fever, vomiting, vomiting blood, abdominal pain, difficulty breathing, chest pain.   Emergency Department Resource Guide 1) Find a Doctor and Pay Out of Pocket Although you won't have to find out who is covered by your insurance plan, it is a good idea to ask around and get recommendations. You will then need to call the office and see if the doctor you have chosen will accept you as a new patient and what types of options they offer for patients who are self-pay. Some doctors offer discounts or will set up payment plans for their patients who do not have insurance, but you will need to ask so you aren't surprised when you get to your appointment.  2) Contact Your Local Health Department Not all health departments have doctors that can see patients for sick visits, but many do, so it is worth a call to see if yours does. If you don't know where your local health department is, you can check in your phone book. The CDC also has a tool to help you locate your state's health department, and many state websites also have listings of all of their local health departments.  3) Find a Walk-in Clinic If your illness is not likely to be very severe or complicated, you may want to try a walk in clinic. These are popping up all over the country in pharmacies, drugstores, and shopping centers. They're usually staffed by nurse practitioners or physician assistants that have been trained to treat common illnesses and complaints. They're usually fairly quick and inexpensive. However, if you have serious medical issues or chronic medical problems, these are probably not your best option.  No Primary Care Doctor: - Call Health Connect at  [phone] -  they can help you locate a primary care doctor that  accepts your insurance, provides certain services, etc. - Physician Referral Service- [phone]  Chronic Pain Problems: Organization         Address  Phone   Notes  [facility]  [phone] Patients need to be referred by their primary care doctor.   Medication Assistance: Organization         Address  Phone   Notes  [facility] [address] [place] [place], [place] [address] [phone] --Must be a resident of [facility] -- Must have NO insurance coverage whatsoever (no Medicaid/ Medicare, etc.) -- The pt. MUST have a primary care doctor that directs their care regularly and follows them in the community   [facility]  [phone]   [employer]  [phone]    Agencies that provide inexpensive medical care: Organization         Address  Phone   Notes  [facility]  [phone]   [facility]    [phone]   [facility] [address] [place], [place] [address] [phone]   [facility] [address] [place] [place] [phone]   Planned Parenthood    [phone]   [facility]    [phone]   Community Health and [facility]  [address], [place] Phone:  [phone], Fax:  [phone] Hours  of Operation:  9 am - 6 pm, M-F.  Also accepts Medicaid/Medicare and self-pay.  [facility] for [place], [address], [place] Phone: [phone], Fax: [phone]. Hours of Operation:  8:30 am - 5:30 pm, M-F.  Also accepts Medicaid and self-pay.  [facility] [place] [address], [place] Phone: [phone]   [place], [place], [place] [phone], Ext. [phone] Mondays & Thursdays: 7-9 AM.  First 15 patients are seen on a first come, first serve basis.    [place] Providers:  Organization         Address  Phone   Notes  [facility] [address], [place] [phone] Also accepts self-pay patients.  [facility] [address], [place]  [phone]   Agency, [address], [place] [phone]   [facility] [address], [place] [phone]   Lucianne Lei [address], [place]   [phone] Only accepts [place] Access [place] patients after they have their name applied to their card.   Self-Pay (no insurance) in [facility]:  Organization         Address  Phone   Notes  Sickle Cell Patients, [facility] [place] [phone]   [facility] Urgent Care [place] [phone]   [facility] [place]  [place], [place], [place] [phone]   [facility]/Dr. Osei-Bonsu  [address], [place] or [address], [place] [phone] Phone number for both [place] and [place] locations is the same.  Urgent Medical and [facility] [address] [place] [phone]   [facility] [address] [place] or [address] [phone]   [facility] [address] [place] [phone], phone; [phone], fax Sees patients 1st and 3rd Saturday of every month.  Must not qualify for public or private insurance (i.e. Medicaid, Medicare, [place] Health Choice, Veterans' Benefits)  Household income should be no more than 200% of the poverty level The clinic cannot treat you if you are pregnant or think you are pregnant  Sexually transmitted diseases are not treated at the clinic.    Dental Care: Organization         Address  Phone  Notes  [facility] Department of [facility] [place] [phone] Accepts children up to age 47 who are enrolled in [place] or [place]; pregnant women with a Medicaid card; and children who have applied for Medicaid or [place] Health Choice, but were declined, whose parents can pay a reduced fee at time of service.  [facility] of [facility]  [address], [place] [phone] Accepts children up to age 56 who are enrolled in [place] or [place]; pregnant women with a Medicaid card; and children who have applied for Medicaid or  Health Choice, but were declined, whose parents can pay a reduced fee at time of service.  [place] Adult Dental Access PROGRAM  [place] [phone] Patients are seen by appointment only. Walk-ins are not accepted. [place] will see  patients 37 years of age and older. Monday - Tuesday (8am-5pm) Most Wednesdays (8:30-5pm) $30 per visit, cash only  [facility] Adult Dental Access PROGRAM  [address], [facility] [phone] Patients are seen by appointment only. Walk-ins are not accepted. [place] will see patients 65 years of age and older. One Wednesday Evening (Monthly: Volunteer Based).  $30 per visit, cash only  [place]  [phone] for adults; Children under age 24, call Graduate Pediatric Dentistry at [phone]. Children aged 76-14, please call [phone] to request a pediatric application.  Dental services are provided in all areas of dental care including fillings, crowns and bridges, complete and partial dentures, implants, gum treatment, root canals, and extractions. Preventive care is also provided. Treatment is provided to both adults and children. Patients are selected via a lottery and there is often a waiting list.   [facility] [address], [place]  [phone] [email or web address]   [facility] [address] [place], [place] [phone], Ext. [phone] Second and Fourth Thursday of each month, opens at 6:30 AM; Clinic ends at 9 AM.  Patients are seen on a first-come first-served basis, and a limited number are seen during each clinic.   [facility]  [address] Hillard Danker [place], [place] [phone]   Eligibility Requirements You must have lived in [place], [place], or [place] counties for at least the last three months.   You cannot be eligible for state or federal sponsored [employer], including [employer], [place], or [employer].   You generally cannot be eligible for healthcare insurance through your employer.    How to apply: Eligibility screenings are held every Tuesday and Wednesday afternoon from 1:00 pm until 4:00 pm. You do not need an appointment for the interview!  [facility] [address], [place], [place]   [place]  [place] Department  [place]  [phone]    Behavioral Health Resources in the Community: Intensive Outpatient Programs Organization         Address  Phone  Notes  [place]. [address], [place], [place] [phone]   [facility] [address], [place], [place]   ADS: [facility] [address] [place], [place]   [place] [address],  [place], [place] or [phone]   Substance Abuse Resources Organization         Address  Phone  Notes  Alcohol and Drug Services  [phone]   [place]  [phone]   [facility] [place]   Chinita Pester  [phone]   Residential & Outpatient Substance Abuse Program  [phone]   Psychological Services Organization         Address  Phone  Notes  [facility] [place]   [place]  [phone]   [place] [address] [place] or [phone]    Mobile Crisis Teams Organization         Address  Phone  Notes  [facility], Mobile Crisis Care Unit  [phone]   [facility]  [address] [place], [place]   [facility] [address] [place] [phone]    Self-Help/Support Groups Organization  Address  Phone             Notes  [facility] [place] - variety of support groups  336- [phone] Call for more information  Narcotics Anonymous (NA), [facility] [address], [place]  2 meetings at this location   Statisticianesidential Treatment Programs Organization         Address  Phone  Notes  [facility] [address],    [place]  [phone]   [facility]  [address], [place] [phone], [place], [place] [phone]   [facility] [address], [place] [phone] Admissions: 8am-3pm M-F  [facility] [address]    [place], [place] [phone]   [facility] [address], [place], [place] [phone]   [facility] [address]  [place], [place] [phone]   [facility] - Intensive Outpatient [address], [place], [place] [phone]   [facility] ([facility] [address]  [place], [place] [phone] or [phone]   [facility] Services (RTS) [address] [place], [place] [phone] Accepts Medicaid  [facility] [address]  [place] [phone] Substance Abuse/Addiction Treatment   [facility] Behavioral Health Resources Organization         Address  Phone  Notes  [facility]  [phone]) [phone]   Angie FavaJulie Brannon, PhD [address], [address] [place], [place]   [phone]) [phone] or  [phone](336) [phone]   [facility]   [address] [place], [place] [phone](336) [phone]   [facility] [address], [place], [place] [phone]) [phone] Insurance/Medicaid/sponsorship through [facility]  [facility] [address]                                    [place], [place] [phone]) [phone] Therapy/tele-psych/case  [facility] [address].   [place], [place] [phone]) [phone]    Dr. Lolly MustacheArfeen  [phone](336) [phone]   [facility]  [employer] [facility] 1) [address], [place] 2) [address], [place] 3)  [address], [place] [phone](336) [phone]  [phone](336) [phone]   [facility] Child Abuse Hotline [phone]) [phone] or [phone](336) [phone] (After Hours)

## 2015-05-11 NOTE — ED Notes (Signed)
Pt. Stated, I woke up with vomiting and nausea around 400 this morning with chills. Pt. Vomited at triage.

## 2015-05-11 NOTE — ED Provider Notes (Signed)
CSN: [id number]     Arrival date & time 05/11/15  1201 History   First MD Initiated Contact with Patient 05/11/15 1241     Chief Complaint  Patient presents with  . Emesis  . Nausea  . Chills     (Consider location/radiation/quality/duration/timing/severity/associated sxs/prior Treatment) HPI   Patient is a 29 year old male with past medical history of gastritis who presents to the ED with complaint of nausea/vomiting, onset 4 AM. Patient reports he woke up this morning with nausea and has had over 10 episodes of NBNB vomiting. Patient also endorses 2 episodes of nonbloody diarrhea, nonproductive cough, rhinorrhea, nasal congestion and chills. He also endorses having constant sharp pain to his epigastric region, denies any aggravating or alleviating factors. Denies fever, headache, sore throat, difficulty breathing, wheezing, chest pain, urinary symptoms. Patient denies taking any medications prior to arrival. Denies any sick contacts.  Past Medical History  Diagnosis Date  . Gastritis    History reviewed. No pertinent past surgical history. No family history on file. Social History  Substance Use Topics  . Smoking status: Current Every Day Smoker -- 1.00 packs/day    Types: Cigarettes  . Smokeless tobacco: None  . Alcohol Use: Yes     Comment: Daily    Review of Systems  Gastrointestinal: Positive for nausea, vomiting, abdominal pain and diarrhea.  All other systems reviewed and are negative.     Allergies  Review of patient's allergies indicates no known allergies.  Home Medications   Prior to Admission medications   Medication Sig Start Date End Date Taking? Authorizing Provider  ibuprofen (ADVIL,MOTRIN) 200 MG tablet Take 200 mg by mouth every 6 (six) hours as needed for moderate pain.   Yes Historical Provider, MD  methocarbamol (ROBAXIN) 500 MG tablet Take 1 tablet (500 mg total) by mouth 2 (two) times daily. Patient not taking: Reported on 05/11/2015 08/08/14    Fayrene HelperBowie Tran, PA-C  naproxen (NAPROSYN) 500 MG tablet Take 1 tablet (500 mg total) by mouth 2 (two) times daily with a meal. Patient not taking: Reported on 05/11/2015 08/05/14   Dahlia ClientHannah Muthersbaugh, PA-C  ondansetron (ZOFRAN ODT) 4 MG disintegrating tablet Take 1 tablet (4 mg total) by mouth every 8 (eight) hours as needed for nausea or vomiting. 05/11/15   Barrett HenleNicole Elizabeth Nadeau, PA-C  oxyCODONE-acetaminophen (PERCOCET) 10-325 MG per tablet Take 1 tablet by mouth every 4 (four) hours as needed for pain. Patient not taking: Reported on 05/11/2015 08/08/14   Fayrene HelperBowie Tran, PA-C   BP 147/87 mmHg  Pulse 69  Temp(Src) 98.3 F (36.8 C) (Oral)  Resp 18  Ht 5\' 9"  (1.753 m)  Wt 75.751 kg  BMI 24.65 kg/m2  SpO2 100% Physical Exam  Constitutional: He is oriented to person, place, and time. He appears well-developed and well-nourished. No distress.  HENT:  Head: Normocephalic and atraumatic.  Mouth/Throat: Oropharynx is clear and moist. No oropharyngeal exudate.  Eyes: Conjunctivae and EOM are normal. Right eye exhibits no discharge. Left eye exhibits no discharge. No scleral icterus.  Neck: Normal range of motion. Neck supple.  Cardiovascular: Normal rate, regular rhythm, normal heart sounds and intact distal pulses.   Pulmonary/Chest: Effort normal and breath sounds normal. No respiratory distress. He has no wheezes. He has no rales. He exhibits no tenderness.  Abdominal: Soft. Bowel sounds are normal. He exhibits no distension and no mass. There is tenderness in the epigastric area. There is no rigidity, no rebound, no guarding, no tenderness at McBurney's point and negative Murphy's  sign.  Musculoskeletal: Normal range of motion. He exhibits no edema.  Lymphadenopathy:    He has no cervical adenopathy.  Neurological: He is alert and oriented to person, place, and time.  Skin: Skin is warm and dry. He is not diaphoretic.  Nursing note and vitals reviewed.   ED Course  Procedures (including critical  care time) Labs Review Labs Reviewed  COMPREHENSIVE METABOLIC PANEL - Abnormal; Notable for the following:    Glucose, Bld 137 (*)    Creatinine, Ser 1.25 (*)    All other components within normal limits  CBC - Abnormal; Notable for the following:    WBC 12.8 (*)    All other components within normal limits  URINALYSIS, ROUTINE W REFLEX MICROSCOPIC (NOT AT [facility]) - Abnormal; Notable for the following:    APPearance CLOUDY (*)    pH 8.5 (*)    Ketones, ur 15 (*)    Protein, ur 30 (*)    All other components within normal limits  URINE MICROSCOPIC-ADD ON - Abnormal; Notable for the following:    Bacteria, UA RARE (*)    All other components within normal limits  LIPASE, BLOOD    Imaging Review No results found. I have personally reviewed and evaluated these images and lab results as part of my medical decision-making.  Filed Vitals:   05/11/15 1445 05/11/15 1500  BP: 147/87   Pulse:  69  Temp:    Resp:       MDM   Final diagnoses:  Viral gastroenteritis    Patient presents with nausea, NBNB vomiting, non-bloody diarrhea and epigastric pain. Denies fever. Denies any sick contacts. VSS. Exam revealed mild epigastric tenderness, no peritoneal signs. Pt given IVF and IV zofran. Labs unremarkable. Patient reevaluated and reports his nausea has improved. Patient given GI cocktail.  2:45 - Patient reports his pain and nausea/vomiting have significantly improved. Will PO challenge pt.   Patient able to tolerate by mouth. Plan to discharge patient home with prescription for Zofran. Patient given resource guide to follow up with primary care provider. Evaluation does not show pathology requring ongoing emergent intervention or admission. Pt is hemodynamically stable and mentating appropriately. Discussed findings/results and plan with patient/guardian, who agrees with plan. All questions answered. Return precautions discussed and outpatient follow up given.    Satira Sark  [place], [place] 05/11/15 1534  Courteney Randall An, MD 05/11/15 1559

## 2016-04-07 ENCOUNTER — Emergency Department (HOSPITAL_COMMUNITY)
Admission: EM | Admit: 2016-04-07 | Discharge: 2016-04-07 | Disposition: A | Discharge status: Home / Self Care | Payer: Self-pay | Attending: Emergency Medicine | Admitting: Emergency Medicine

## 2016-04-07 ENCOUNTER — Encounter (HOSPITAL_COMMUNITY): Payer: Self-pay | Admitting: Emergency Medicine

## 2016-04-07 DIAGNOSIS — F17210 Nicotine dependence, cigarettes, uncomplicated: Secondary | ICD-10-CM | POA: Insufficient documentation

## 2016-04-07 DIAGNOSIS — R1013 Epigastric pain: Secondary | ICD-10-CM | POA: Insufficient documentation

## 2016-04-07 DIAGNOSIS — R112 Nausea with vomiting, unspecified: Secondary | ICD-10-CM | POA: Insufficient documentation

## 2016-04-07 LAB — COMPREHENSIVE METABOLIC PANEL
ALBUMIN: 4.3 g/dL (ref 3.5–5.0)
ALK PHOS: 51 U/L (ref 38–126)
ALT: 25 U/L (ref 17–63)
ANION GAP: 9 (ref 5–15)
AST: 29 U/L (ref 15–41)
BUN: 8 mg/dL (ref 6–20)
CALCIUM: 9.6 mg/dL (ref 8.9–10.3)
CO2: 26 mmol/L (ref 22–32)
Chloride: 104 mmol/L (ref 101–111)
Creatinine, Ser: 1.23 mg/dL (ref 0.61–1.24)
GFR calc Af Amer: >60 mL/min (ref >60)
GFR calc non Af Amer: >60 mL/min (ref >60)
GLUCOSE: 112 mg/dL — AB (ref 65–99)
POTASSIUM: 3.6 mmol/L (ref 3.5–5.1)
SODIUM: 139 mmol/L (ref 135–145)
Total Bilirubin: 0.9 mg/dL (ref 0.3–1.2)
Total Protein: 7 g/dL (ref 6.5–8.1)

## 2016-04-07 LAB — URINALYSIS, ROUTINE W REFLEX MICROSCOPIC
BILIRUBIN URINE: NEGATIVE
GLUCOSE, UA: NEGATIVE mg/dL
HGB URINE DIPSTICK: NEGATIVE
Ketones, ur: 15 mg/dL — AB
Leukocytes, UA: NEGATIVE
Nitrite: NEGATIVE
PH: 7 (ref 5.0–8.0)
Protein, ur: NEGATIVE mg/dL
SPECIFIC GRAVITY, URINE: 1.019 (ref 1.005–1.030)

## 2016-04-07 LAB — CBC
HEMATOCRIT: 45.6 % (ref 39.0–52.0)
HEMOGLOBIN: 16.4 g/dL (ref 13.0–17.0)
MCH: 29.1 pg (ref 26.0–34.0)
MCHC: 36 g/dL (ref 30.0–36.0)
MCV: 80.9 fL (ref 78.0–100.0)
Platelets: 369 10*3/uL (ref 150–400)
RBC: 5.64 MIL/uL (ref 4.22–5.81)
RDW: 12.7 % (ref 11.5–15.5)
WBC: 14 10*3/uL — ABNORMAL HIGH (ref 4.0–10.5)

## 2016-04-07 LAB — LIPASE, BLOOD: Lipase: 74 U/L — ABNORMAL HIGH (ref 11–51)

## 2016-04-07 MED ORDER — SODIUM CHLORIDE 0.9 % IV SOLN: Freq: Once | INTRAVENOUS | Status: DC

## 2016-04-07 MED ORDER — METOCLOPRAMIDE HCL 5 MG/ML IJ SOLN
10.0000 mg | Freq: Once | INTRAMUSCULAR | Status: AC
  Administered 2016-04-07: 10 mg via INTRAVENOUS
  Filled 2016-04-07: qty 2

## 2016-04-07 MED ORDER — SODIUM CHLORIDE 0.9 % IV BOLUS (SEPSIS)
1000.0000 mL | Freq: Once | INTRAVENOUS | Status: AC
  Administered 2016-04-07: 1000 mL via INTRAVENOUS

## 2016-04-07 MED ORDER — METOCLOPRAMIDE HCL 10 MG PO TABS: 10.0000 mg | ORAL_TABLET | Freq: Four times a day (QID) | ORAL | 0 refills | Status: DC

## 2016-04-07 MED ORDER — ONDANSETRON HCL 4 MG/2ML IJ SOLN
4.0000 mg | Freq: Once | INTRAMUSCULAR | Status: DC | PRN
  Filled 2016-04-07: qty 2

## 2016-04-07 NOTE — Discharge Instructions (Signed)
Take reglan as prescribed as needed for nausea and vomiting. Drink plenty of fluids. Advance diet as tolerate. Follow up with primary care doctor. Return if worsening.

## 2016-04-07 NOTE — ED Notes (Signed)
PA at bedside.

## 2016-04-07 NOTE — ED Triage Notes (Signed)
Pt states for the last 2 days he has been having sharp upper abd pains that sometimes move to his lower abd. Pt actively vomiting in triage.

## 2016-04-07 NOTE — ED Notes (Signed)
Pt ambulated to restroom. 

## 2016-04-07 NOTE — ED Provider Notes (Signed)
[facility]-EMERGENCY DEPT Provider Note   CSN: [id number] Arrival date & time: 04/07/16  0805     History   Chief Complaint Chief Complaint  Patient presents with  . Abdominal Pain  . Emesis    HPI Michael Adams is a 30 y.o. male.  HPI Michael ParkinsonLazelle F Bankson is a 30 y.o. male presents to emergency department with recurrent nausea, vomiting, abdominal pain. Patient states symptoms have been going on for 2 days. Symptoms are intermittent. States he has epigastric pain, nausea, vomiting. Pain is sharp, comes and goes. Denies diarrhea. States he normally takes omeprazole, but states he ran out. Did not try anything at home. He states hot baths to help. Denies hematemesis. Denies back pain. No urinary symptoms. No fever or chills. He does admit to drinking alcohol occasionally. None in the last several days. He does smoke cigarettes. He also smokes marijuana daily. No treatment prior to coming in.  Past Medical History:  Diagnosis Date  . Gastritis     There are no active problems to display for this patient.   History reviewed. No pertinent surgical history.     Home Medications    Prior to Admission medications   Medication Sig Start Date End Date Taking? Authorizing Provider  ibuprofen (ADVIL,MOTRIN) 200 MG tablet Take 200 mg by mouth every 6 (six) hours as needed for moderate pain.    Historical Provider, MD  methocarbamol (ROBAXIN) 500 MG tablet Take 1 tablet (500 mg total) by mouth 2 (two) times daily. Patient not taking: Reported on 05/11/2015 08/08/14   Fayrene HelperBowie Tran, PA-C  naproxen (NAPROSYN) 500 MG tablet Take 1 tablet (500 mg total) by mouth 2 (two) times daily with a meal. Patient not taking: Reported on 05/11/2015 08/05/14   Dahlia ClientHannah Muthersbaugh, PA-C  ondansetron (ZOFRAN ODT) 4 MG disintegrating tablet Take 1 tablet (4 mg total) by mouth every 8 (eight) hours as needed for nausea or vomiting. 05/11/15   Barrett HenleNicole Elizabeth Nadeau, PA-C  oxyCODONE-acetaminophen (PERCOCET) 10-325 MG per  tablet Take 1 tablet by mouth every 4 (four) hours as needed for pain. Patient not taking: Reported on 05/11/2015 08/08/14   Fayrene HelperBowie Tran, PA-C    Family History No family history on file.  Social History Social History  Substance Use Topics  . Smoking status: Current Every Day Smoker    Packs/day: 1.00    Types: Cigarettes  . Smokeless tobacco: Not on file  . Alcohol use Yes     Comment: Daily     Allergies   Review of patient's allergies indicates no known allergies.   Review of Systems Review of Systems  Constitutional: Negative for chills and fever.  Respiratory: Negative for cough, chest tightness and shortness of breath.   Cardiovascular: Negative for chest pain, palpitations and leg swelling.  Gastrointestinal: Positive for abdominal pain, nausea and vomiting. Negative for abdominal distention and diarrhea.  Genitourinary: Negative for dysuria, frequency, hematuria and urgency.  Musculoskeletal: Negative for arthralgias, myalgias, neck pain and neck stiffness.  Skin: Negative for rash.  Allergic/Immunologic: Negative for immunocompromised state.  Neurological: Negative for dizziness, weakness, light-headedness, numbness and headaches.  All other systems reviewed and are negative.    Physical Exam Updated Vital Signs BP (!) 164/106 (BP Location: Right Arm)   Pulse 77   Temp 98.1 F (36.7 C) (Oral)   Resp 16   Ht 5\' 9"  (1.753 m)   Wt 75.8 kg   SpO2 100%   BMI 24.66 kg/m   Physical Exam  Constitutional: He  appears well-developed and well-nourished. No distress.  HENT:  Head: Normocephalic and atraumatic.  Eyes: Conjunctivae are normal.  Neck: Neck supple.  Cardiovascular: Normal rate, regular rhythm and normal heart sounds.   Pulmonary/Chest: Effort normal. No respiratory distress. He has no wheezes. He has no rales.  Abdominal: Soft. Bowel sounds are normal. He exhibits no distension. There is tenderness. There is no rebound and no guarding.  TTP in  epigastric area  Musculoskeletal: He exhibits no edema.  Neurological: He is alert.  Skin: Skin is warm and dry.  Nursing note and vitals reviewed.    ED Treatments / Results  Labs (all labs ordered are listed, but only abnormal results are displayed) Labs Reviewed  LIPASE, BLOOD - Abnormal; Notable for the following:       Result Value   Lipase 74 (*)    All other components within normal limits  COMPREHENSIVE METABOLIC PANEL - Abnormal; Notable for the following:    Glucose, Bld 112 (*)    All other components within normal limits  CBC - Abnormal; Notable for the following:    WBC 14.0 (*)    All other components within normal limits  URINALYSIS, ROUTINE W REFLEX MICROSCOPIC (NOT AT [facility])    EKG  EKG Interpretation None       Radiology No results found.  Procedures Procedures (including critical care time)  Medications Ordered in ED Medications  ondansetron (ZOFRAN) injection 4 mg (not administered)  metoCLOPramide (REGLAN) injection 10 mg (not administered)     Initial Impression / Assessment and Plan / ED Course  I have reviewed the triage vital signs and the nursing notes.  Pertinent labs & imaging results that were available during my care of the patient were reviewed by me and considered in my medical decision making (see chart for details).  Clinical Course    8:35 AM Patient in in the emergency department with nausea, vomiting, epigastric pain. This is a recurrent problem. Patient is in no acute distress. Normal vital signs other than hypertensive. Abdomen is tender in epigastric area, but no rebound tenderness, guarding, distention. No diarrhea. Will get labs, try Reglan, Zofran, IV fluids.   10:19 AM  Patient's blood work showed mildly elevated lipase at 74, mildly elevated white blood cell count of 14. Otherwise unremarkable. Patient is feeling much better after Reglan and Zofran. His abdomen is nontender at this time. He is not vomiting. He was  able to drink apple juice and water. No recurrent nausea. Most likely gastroenteritis versus cyclical vomiting from cannabis use. Will discharge home with Reglan for nausea. Instructed to drink plenty of fluids. Rest. Stop smoking marijuana. I do not think he needs any further imaging or work up at this time.  Follow-up with a family doctor.  Vitals:   04/07/16 0823 04/07/16 0825 04/07/16 0830 04/07/16 0945  BP:  (!) 164/106 141/98 117/79  Pulse:  77 75 88  Resp:  16 17 16   Temp:  98.1 F (36.7 C)    TempSrc:  Oral    SpO2:  100% 100% 100%  Weight: 75.8 kg     Height: 5\' 9"  (1.753 m)        Final Clinical Impressions(s) / ED Diagnoses   Final diagnoses:  Nausea and vomiting, intractability of vomiting not specified, unspecified vomiting type    New Prescriptions New Prescriptions   METOCLOPRAMIDE (REGLAN) 10 MG TABLET    Take 1 tablet (10 mg total) by mouth every 6 (six) hours.  Jaynie Crumble, PA-C 04/07/16 1022    Loren Racer, MD 04/09/16 [phone]

## 2016-04-07 NOTE — ED Notes (Signed)
pA at bedside.
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
## 2017-01-12 ENCOUNTER — Emergency Department (HOSPITAL_COMMUNITY)
Admission: EM | Admit: 2017-01-12 | Discharge: 2017-01-12 | Disposition: A | Discharge status: Home / Self Care | Payer: Self-pay | Attending: Emergency Medicine | Admitting: Emergency Medicine

## 2017-01-12 ENCOUNTER — Encounter (HOSPITAL_COMMUNITY): Payer: Self-pay | Admitting: Emergency Medicine

## 2017-01-12 ENCOUNTER — Emergency Department (HOSPITAL_COMMUNITY)
Admission: EM | Admit: 2017-01-12 | Discharge: 2017-01-13 | Discharge status: Against Medical Advice | Payer: Self-pay | Attending: Emergency Medicine | Admitting: Emergency Medicine

## 2017-01-12 ENCOUNTER — Encounter (HOSPITAL_COMMUNITY): Payer: Self-pay | Admitting: *Deleted

## 2017-01-12 DIAGNOSIS — Z5321 Procedure and treatment not carried out due to patient leaving prior to being seen by health care provider: Secondary | ICD-10-CM | POA: Insufficient documentation

## 2017-01-12 DIAGNOSIS — R1110 Vomiting, unspecified: Secondary | ICD-10-CM | POA: Insufficient documentation

## 2017-01-12 LAB — CBC
HCT: 46.5 % (ref 39.0–52.0)
HEMATOCRIT: 47.2 % (ref 39.0–52.0)
Hemoglobin: 16.9 g/dL (ref 13.0–17.0)
Hemoglobin: 17.3 g/dL — ABNORMAL HIGH (ref 13.0–17.0)
MCH: 28.6 pg (ref 26.0–34.0)
MCH: 29.1 pg (ref 26.0–34.0)
MCHC: 36.3 g/dL — AB (ref 30.0–36.0)
MCHC: 36.7 g/dL — AB (ref 30.0–36.0)
MCV: 78.8 fL (ref 78.0–100.0)
MCV: 79.5 fL (ref 78.0–100.0)
Platelets: 420 10*3/uL — ABNORMAL HIGH (ref 150–400)
Platelets: 428 10*3/uL — ABNORMAL HIGH (ref 150–400)
RBC: 5.9 MIL/uL — ABNORMAL HIGH (ref 4.22–5.81)
RBC: 5.94 MIL/uL — ABNORMAL HIGH (ref 4.22–5.81)
RDW: 13.1 % (ref 11.5–15.5)
RDW: 13.1 % (ref 11.5–15.5)
WBC: 14.6 10*3/uL — ABNORMAL HIGH (ref 4.0–10.5)
WBC: 16.6 10*3/uL — ABNORMAL HIGH (ref 4.0–10.5)

## 2017-01-12 LAB — COMPREHENSIVE METABOLIC PANEL
ALBUMIN: 3.6 g/dL (ref 3.5–5.0)
ALBUMIN: 4.1 g/dL (ref 3.5–5.0)
ALT: 32 U/L (ref 17–63)
ALT: 36 U/L (ref 17–63)
AST: 37 U/L (ref 15–41)
AST: 36 U/L (ref 15–41)
Alkaline Phosphatase: 54 U/L (ref 38–126)
Alkaline Phosphatase: 54 U/L (ref 38–126)
Anion gap: 12 (ref 5–15)
Anion gap: 13 (ref 5–15)
BILIRUBIN TOTAL: 1.3 mg/dL — AB (ref 0.3–1.2)
BILIRUBIN TOTAL: 1.2 mg/dL (ref 0.3–1.2)
BUN: 6 mg/dL (ref 6–20)
BUN: 10 mg/dL (ref 6–20)
CHLORIDE: 100 mmol/L — AB (ref 101–111)
CHLORIDE: 99 mmol/L — AB (ref 101–111)
CO2: 26 mmol/L (ref 22–32)
CO2: 28 mmol/L (ref 22–32)
Calcium: 9.1 mg/dL (ref 8.9–10.3)
Calcium: 9.3 mg/dL (ref 8.9–10.3)
Creatinine, Ser: 1.39 mg/dL — ABNORMAL HIGH (ref 0.61–1.24)
Creatinine, Ser: 1.43 mg/dL — ABNORMAL HIGH (ref 0.61–1.24)
GFR calc Af Amer: >60 mL/min (ref >60)
GFR calc Af Amer: >60 mL/min (ref >60)
GFR calc non Af Amer: >60 mL/min (ref >60)
GFR calc non Af Amer: >60 mL/min (ref >60)
GLUCOSE: 109 mg/dL — AB (ref 65–99)
GLUCOSE: 99 mg/dL (ref 65–99)
POTASSIUM: 3.4 mmol/L — AB (ref 3.5–5.1)
POTASSIUM: 3.4 mmol/L — AB (ref 3.5–5.1)
Sodium: 138 mmol/L (ref 135–145)
Sodium: 140 mmol/L (ref 135–145)
TOTAL PROTEIN: 6.9 g/dL (ref 6.5–8.1)
Total Protein: 6.2 g/dL — ABNORMAL LOW (ref 6.5–8.1)

## 2017-01-12 LAB — I-STAT TROPONIN, ED: TROPONIN I, POC: 0 ng/mL (ref 0.00–0.08)

## 2017-01-12 LAB — LIPASE, BLOOD
LIPASE: 32 U/L (ref 11–51)
Lipase: 32 U/L (ref 11–51)

## 2017-01-12 MED ORDER — ONDANSETRON 4 MG PO TBDP
4.0000 mg | ORAL_TABLET | Freq: Once | ORAL | Status: AC | PRN
  Administered 2017-01-12: 4 mg via ORAL

## 2017-01-12 MED ORDER — ONDANSETRON 4 MG PO TBDP
ORAL_TABLET | ORAL | Status: AC
  Filled 2017-01-12: qty 1

## 2017-01-12 NOTE — ED Triage Notes (Signed)
Patient states he started vomiting three days ago. Patient states that it gets bad in the morning. Patient states he ate last night but not today. Last night was his first meal. No other symptoms.

## 2017-01-12 NOTE — ED Triage Notes (Signed)
Pt reports having upper abd pain, chest pain with n/v. Pain started on Sunday and has been intermittent. Hx of gastritis but does not take any meds. Vomiting on arrival.

## 2017-01-12 NOTE — ED Notes (Signed)
Pt called x2, no response. Notified Greg(RN)

## 2017-01-12 NOTE — ED Notes (Signed)
This RN called pt through overhead, no answer.

## 2017-01-13 NOTE — ED Notes (Signed)
Patient called to a room  X 3 and no answer.

## 2017-12-17 ENCOUNTER — Encounter (HOSPITAL_COMMUNITY): Payer: Self-pay

## 2017-12-17 ENCOUNTER — Other Ambulatory Visit: Payer: Self-pay

## 2017-12-17 ENCOUNTER — Emergency Department (HOSPITAL_COMMUNITY)
Admission: EM | Admit: 2017-12-17 | Discharge: 2017-12-17 | Disposition: A | Discharge status: Home / Self Care | Payer: Self-pay | Attending: Emergency Medicine | Admitting: Emergency Medicine

## 2017-12-17 DIAGNOSIS — F17210 Nicotine dependence, cigarettes, uncomplicated: Secondary | ICD-10-CM | POA: Insufficient documentation

## 2017-12-17 DIAGNOSIS — R1084 Generalized abdominal pain: Secondary | ICD-10-CM

## 2017-12-17 DIAGNOSIS — R112 Nausea with vomiting, unspecified: Secondary | ICD-10-CM

## 2017-12-17 DIAGNOSIS — Z79899 Other long term (current) drug therapy: Secondary | ICD-10-CM | POA: Insufficient documentation

## 2017-12-17 LAB — CBC
HCT: 47.7 % (ref 39.0–52.0)
Hemoglobin: 16.6 g/dL (ref 13.0–17.0)
MCH: 28.8 pg (ref 26.0–34.0)
MCHC: 34.8 g/dL (ref 30.0–36.0)
MCV: 82.7 fL (ref 78.0–100.0)
Platelets: 406 10*3/uL — ABNORMAL HIGH (ref 150–400)
RBC: 5.77 MIL/uL (ref 4.22–5.81)
RDW: 12.7 % (ref 11.5–15.5)
WBC: 13.9 10*3/uL — ABNORMAL HIGH (ref 4.0–10.5)

## 2017-12-17 LAB — COMPREHENSIVE METABOLIC PANEL
ALT: 25 U/L (ref 0–44)
AST: 27 U/L (ref 15–41)
Albumin: 3.8 g/dL (ref 3.5–5.0)
Alkaline Phosphatase: 54 U/L (ref 38–126)
Anion gap: 12 (ref 5–15)
BUN: 8 mg/dL (ref 6–20)
CO2: 29 mmol/L (ref 22–32)
Calcium: 10.1 mg/dL (ref 8.9–10.3)
Chloride: 98 mmol/L (ref 98–111)
Creatinine, Ser: 1.31 mg/dL — ABNORMAL HIGH (ref 0.61–1.24)
GFR calc Af Amer: >60 mL/min (ref >60)
GFR calc non Af Amer: >60 mL/min (ref >60)
Glucose, Bld: 110 mg/dL — ABNORMAL HIGH (ref 70–99)
Potassium: 3.2 mmol/L — ABNORMAL LOW (ref 3.5–5.1)
Sodium: 139 mmol/L (ref 135–145)
Total Bilirubin: 1.2 mg/dL (ref 0.3–1.2)
Total Protein: 6.9 g/dL (ref 6.5–8.1)

## 2017-12-17 LAB — URINALYSIS, ROUTINE W REFLEX MICROSCOPIC
Bacteria, UA: NONE SEEN
Bilirubin Urine: NEGATIVE
Glucose, UA: NEGATIVE mg/dL
Hgb urine dipstick: NEGATIVE
Ketones, ur: 80 mg/dL — AB
Leukocytes, UA: NEGATIVE
Nitrite: NEGATIVE
Protein, ur: 30 mg/dL — AB
Specific Gravity, Urine: 1.02 (ref 1.005–1.030)
pH: 8 (ref 5.0–8.0)

## 2017-12-17 LAB — LIPASE, BLOOD: Lipase: 35 U/L (ref 11–51)

## 2017-12-17 MED ORDER — MORPHINE SULFATE (PF) 4 MG/ML IV SOLN
2.0000 mg | Freq: Once | INTRAVENOUS | Status: AC
  Administered 2017-12-17: 2 mg via INTRAVENOUS
  Filled 2017-12-17: qty 1

## 2017-12-17 MED ORDER — ONDANSETRON 4 MG PO TBDP: 4.0000 mg | ORAL_TABLET | Freq: Three times a day (TID) | ORAL | 0 refills | Status: DC | PRN

## 2017-12-17 MED ORDER — SODIUM CHLORIDE 0.9 % IV BOLUS
1000.0000 mL | Freq: Once | INTRAVENOUS | Status: AC
  Administered 2017-12-17: 1000 mL via INTRAVENOUS

## 2017-12-17 MED ORDER — ONDANSETRON 4 MG PO TBDP
4.0000 mg | ORAL_TABLET | Freq: Once | ORAL | Status: AC | PRN
  Administered 2017-12-17: 4 mg via ORAL
  Filled 2017-12-17: qty 1

## 2017-12-17 MED ORDER — ONDANSETRON HCL 4 MG/2ML IJ SOLN
4.0000 mg | Freq: Once | INTRAMUSCULAR | Status: AC
  Administered 2017-12-17: 4 mg via INTRAVENOUS
  Filled 2017-12-17: qty 2

## 2017-12-17 NOTE — ED Triage Notes (Signed)
Onset 12-14-17 pt was at work, ate [employer] h[employer] and a hour later started vomiting and abd pain.  Sometimes abd pain improves with vomiting.  Unable to keep any PO fluid/foods down.  No diarrhea.  No one in household sick.

## 2017-12-17 NOTE — Discharge Instructions (Addendum)
Please return to the emergency department for any new or worsening symptoms or if your symptoms do not improve. Please call to establish primary care provider, follow-up with your primary care provider regarding your visit today. You may use Zofran as prescribed to treat nausea.  Contact a doctor if: You have a fever. You cannot keep fluids down. Your symptoms get worse. You have new symptoms. You feel sick to your stomach for more than two days. You feel light-headed or dizzy. You have a headache. You have muscle cramps. Get help right away if: You have pain in your chest, neck, arm, or jaw. You feel very weak or you pass out (faint). You throw up again and again. You see blood in your throw-up. Your throw-up looks like black coffee grounds. You have bloody or black poop (stools) or poop that look like tar. You have a very bad headache, a stiff neck, or both. You have a rash. You have very bad pain, cramping, or bloating in your belly (abdomen). You have trouble breathing. You are breathing very quickly. Your heart is beating very quickly. Your skin feels cold and clammy. You feel confused. You have pain when you pee. You have signs of dehydration, such as: Dark pee, hardly any pee, or no pee. Cracked lips. Dry mouth. Sunken eyes. Sleepiness. Weakness. Contact a doctor if: Your belly pain changes or gets worse. You are not hungry, or you lose weight without trying. You are having trouble pooping (constipated) or have watery poop (diarrhea) for more than 2-3 days. You have pain when you pee or poop. Your belly pain wakes you up at night. Your pain gets worse with meals, after eating, or with certain foods. You are throwing up and cannot keep anything down. You have a fever. Get help right away if: Your pain does not go away as soon as your doctor says it should. You cannot stop throwing up. Your pain is only in areas of your belly, such as the right side or the left  lower part of the belly. You have bloody or black poop, or poop that looks like tar. You have very bad pain, cramping, or bloating in your belly. You have signs of not having enough fluid or water in your body (dehydration), such as: Dark pee, very little pee, or no pee. Cracked lips. Dry mouth. Sunken eyes. Sleepiness. Weakness.

## 2017-12-17 NOTE — ED Notes (Signed)
No E-signature available; pt is stable for discharge and states understanding of discharge instructions  

## 2017-12-17 NOTE — ED Provider Notes (Signed)
[facility] Provider Note   CSN: [id number] Arrival date & time: 12/17/17  1047     History   Chief Complaint Chief Complaint  Patient presents with  . Abdominal Pain  . Emesis    HPI Michael Adams is a 32 y.o. male presenting for 3 days of abdominal pain.  Patient states that he began having nausea and vomiting as well as cramping abdominal pain that began after eating a hamburger on Wednesday.  Patient describes his abdominal pain as a cramping pain in the center of his abdomen that is improved with vomiting.  Patient describes his pain as a 3/10, and he states that he is unable to eat or drink because this increases his pain and causes vomiting.  Patient has not taken anything for his pain. Denies fever, diarrhea, hematemesis, hematuria, change in urination, blood in the stool, dysuria.  HPI  Past Medical History:  Diagnosis Date  . Gastritis     There are no active problems to display for this patient.   History reviewed. No pertinent surgical history.      Home Medications    Prior to Admission medications   Medication Sig Start Date End Date Taking? Authorizing Provider  Calcium Carbonate Antacid (TUMS PO) Take 1 tablet by mouth 5 (five) times daily as needed (acid reflux/heartburn).   Yes [provider]  Dextrose-Fructose-Sod Citrate (NAUZENE PO) Take 2-4 tablets by mouth See admin instructions. Take 2-4 tablets by mouth as needed for nausea/vomiting, may repeat in 15 minutes if still needed. Do not exceed 6 doses in 24 hours.   Yes [provider]  naproxen sodium (ALEVE) 220 MG tablet Take 440 mg by mouth 2 (two) times daily as needed (headache).   Yes [provider]  PENICILLIN V POTASSIUM PO Take 1 tablet by mouth once.   Yes [provider]  methocarbamol (ROBAXIN) 500 MG tablet Take 1 tablet (500 mg total) by mouth 2 (two) times daily. Patient not taking: Reported on 04/07/2016  08/08/14   Fayrene Helperran, Bowie, PA-C  metoCLOPramide (REGLAN) 10 MG tablet Take 1 tablet (10 mg total) by mouth every 6 (six) hours. Patient not taking: Reported on 12/17/2017 04/07/16   Jaynie CrumbleKirichenko, Tatyana, PA-C  naproxen (NAPROSYN) 500 MG tablet Take 1 tablet (500 mg total) by mouth 2 (two) times daily with a meal. Patient not taking: Reported on 04/07/2016 08/05/14   Muthersbaugh, Dahlia ClientHannah, PA-C  ondansetron (ZOFRAN ODT) 4 MG disintegrating tablet Take 1 tablet (4 mg total) by mouth every 8 (eight) hours as needed for nausea or vomiting. 12/17/17   Bill SalinasMorelli, Rhianna Raulerson A, PA-C  oxyCODONE-acetaminophen (PERCOCET) 10-325 MG per tablet Take 1 tablet by mouth every 4 (four) hours as needed for pain. Patient not taking: Reported on 04/07/2016 08/08/14   Fayrene Helperran, Bowie, PA-C    Family History History reviewed. No pertinent family history.  Social History Social History   Tobacco Use  . Smoking status: Current Every Day Smoker    Packs/day: 1.00    Types: Cigarettes  . Smokeless tobacco: Never Used  Substance Use Topics  . Alcohol use: Yes    Comment: Daily  . Drug use: No     Allergies   Patient has no known allergies.   Review of Systems Review of Systems  Constitutional: Negative.  Negative for chills, fatigue and fever.  HENT: Negative.  Negative for congestion, sore throat and trouble swallowing.   Eyes: Negative.  Negative for visual disturbance.  Respiratory: Negative.  Negative for cough, chest tightness and shortness of breath.   Cardiovascular: Negative.  Negative for chest pain and leg swelling.  Gastrointestinal: Positive for abdominal pain, nausea and vomiting. Negative for blood in stool and diarrhea.  Genitourinary: Negative.  Negative for dysuria, flank pain, hematuria, scrotal swelling and testicular pain.  Musculoskeletal: Negative.  Negative for arthralgias, myalgias and neck pain.  Skin: Negative.  Negative for rash.  Neurological: Negative.  Negative for dizziness, syncope, weakness,  light-headedness and headaches.     Physical Exam Updated Vital Signs BP 130/80 (BP Location: Right Arm)   Pulse 66   Temp 98.6 F (37 C)   Resp 17   SpO2 100%   Physical Exam  Constitutional: He is oriented to person, place, and time. He appears well-developed and well-nourished.  Non-toxic appearance. He does not appear ill. No distress.  HENT:  Head: Normocephalic and atraumatic.  Eyes: Pupils are equal, round, and reactive to light.  Neck: Normal range of motion. Neck supple.  Cardiovascular: Normal rate and regular rhythm.  Pulmonary/Chest: Effort normal and breath sounds normal. No respiratory distress.  Abdominal: Soft. Normal appearance and bowel sounds are normal. He exhibits no distension. There is tenderness in the periumbilical area. There is no rigidity, no rebound, no guarding, no tenderness at McBurney's point and negative Murphy's sign.  Genitourinary:  Genitourinary Comments: Deferred by patient.  Neurological: He is alert and oriented to person, place, and time.  Skin: Skin is warm and dry.  Psychiatric: He has a normal mood and affect. His behavior is normal.     ED Treatments / Results  Labs (all labs ordered are listed, but only abnormal results are displayed) Labs Reviewed  COMPREHENSIVE METABOLIC PANEL - Abnormal; Notable for the following components:      Result Value   Potassium 3.2 (*)    Glucose, Bld 110 (*)    Creatinine, Ser 1.31 (*)    All other components within normal limits  CBC - Abnormal; Notable for the following components:   WBC 13.9 (*)    Platelets 406 (*)    All other components within normal limits  URINALYSIS, ROUTINE W REFLEX MICROSCOPIC - Abnormal; Notable for the following components:   APPearance HAZY (*)    Ketones, ur 80 (*)    Protein, ur 30 (*)    All other components within normal limits  LIPASE, BLOOD    EKG None  Radiology No results found.  Procedures Procedures (including critical care  time)  Medications Ordered in ED Medications  ondansetron (ZOFRAN-ODT) disintegrating tablet 4 mg (4 mg Oral Given 12/17/17 1144)  sodium chloride 0.9 % bolus 1,000 mL (0 mLs Intravenous Stopped 12/17/17 1453)  ondansetron (ZOFRAN) injection 4 mg (4 mg Intravenous Given 12/17/17 1353)  morphine 4 MG/ML injection 2 mg (2 mg Intravenous Given 12/17/17 1658)     Initial Impression / Assessment and Plan / ED Course  I have reviewed the triage vital signs and the nursing notes.  Pertinent labs & imaging results that were available during my care of the patient were reviewed by me and considered in my medical decision making (see chart for details).  Clinical Course as of Dec 17 2124  Sat Dec 17, 2017  1651 On reassessment patient states that his abdominal pain has improved, no longer feeling nauseous, patient requesting food and drink in department.  Patient requesting discharge at this time.   [BM]    Clinical Course User Index [BM] Bill Salinas, PA-C  Patient with abdominal pain, nausea and vomiting for the past 3 days.  Patient is afebrile, non-toxic appearing, sitting comfortably on examination table. Patient's pain and other symptoms adequately managed in emergency department. Fluid bolus given. Labs, imaging and vitals reviewed.  Patient does not meet the SIRS or Sepsis criteria.  On repeat exam patient does not have a surgical abdomen and there are no peritoneal signs.  No indication of appendicitis, bowel obstruction, bowel perforation, cholecystitis, diverticulitis.  Patient's mildly elevated white count is most likely due to his recurrent vomiting for the past 3 days.   Patient discharged home with symptomatic treatment and given strict instructions for follow-up with his primary care physician, I have given patient referral to [facility] health community health and wellness to establish a primary care provider.  I have also discussed reasons to return immediately to the ER.  Patient  expresses understanding of return precautions and agrees with plan.  At this time there does not appear to be any evidence of an acute emergency medical condition and the patient appears stable for discharge with appropriate outpatient follow up. Diagnosis was discussed with patient who verbalizes understanding and is agreeable to discharge. I have discussed return precautions with patient and who verbalizes understanding of return precautions. Patient strongly encouraged to follow-up with their PCP.  Patient's case discussed with Dr. Juleen China who agrees with plan to discharge with symptomatic treatment and with follow-up.     This note was dictated using DragonOne dictation software; please contact for any inconsistencies within the note.     Final Clinical Impressions(s) / ED Diagnoses   Final diagnoses:  Generalized abdominal pain  Non-intractable vomiting with nausea, unspecified vomiting type    ED Discharge Orders        Ordered    ondansetron (ZOFRAN ODT) 4 MG disintegrating tablet  Every 8 hours PRN     12/17/17 1715       Elizabeth Palau 12/17/17 2130    Raeford Razor, MD 12/19/17 1536

## 2017-12-17 NOTE — ED Notes (Signed)
Called x 2 no answer

## 2018-08-29 ENCOUNTER — Other Ambulatory Visit: Payer: Self-pay

## 2018-08-29 ENCOUNTER — Encounter (HOSPITAL_COMMUNITY): Payer: Self-pay | Admitting: Emergency Medicine

## 2018-08-29 ENCOUNTER — Emergency Department (HOSPITAL_COMMUNITY)
Admission: EM | Admit: 2018-08-29 | Discharge: 2018-08-29 | Disposition: A | Discharge status: Home / Self Care | Payer: Self-pay | Attending: Emergency Medicine | Admitting: Emergency Medicine

## 2018-08-29 DIAGNOSIS — E860 Dehydration: Secondary | ICD-10-CM

## 2018-08-29 DIAGNOSIS — F17210 Nicotine dependence, cigarettes, uncomplicated: Secondary | ICD-10-CM | POA: Insufficient documentation

## 2018-08-29 DIAGNOSIS — R112 Nausea with vomiting, unspecified: Secondary | ICD-10-CM | POA: Insufficient documentation

## 2018-08-29 DIAGNOSIS — Z79899 Other long term (current) drug therapy: Secondary | ICD-10-CM | POA: Insufficient documentation

## 2018-08-29 LAB — CBC WITH DIFFERENTIAL/PLATELET
Abs Immature Granulocytes: 0.06 10*3/uL (ref 0.00–0.07)
BASOS PCT: 0 %
Basophils Absolute: 0 10*3/uL (ref 0.0–0.1)
EOS ABS: 0.1 10*3/uL (ref 0.0–0.5)
Eosinophils Relative: 0 %
HCT: 46.4 % (ref 39.0–52.0)
Hemoglobin: 15.8 g/dL (ref 13.0–17.0)
IMMATURE GRANULOCYTES: 0 %
Lymphocytes Relative: 5 %
Lymphs Abs: 0.8 10*3/uL (ref 0.7–4.0)
MCH: 28.3 pg (ref 26.0–34.0)
MCHC: 34.1 g/dL (ref 30.0–36.0)
MCV: 83 fL (ref 80.0–100.0)
MONOS PCT: 4 %
Monocytes Absolute: 0.6 10*3/uL (ref 0.1–1.0)
NEUTROS PCT: 91 %
Neutro Abs: 12.8 10*3/uL — ABNORMAL HIGH (ref 1.7–7.7)
PLATELETS: 455 10*3/uL — AB (ref 150–400)
RBC: 5.59 MIL/uL (ref 4.22–5.81)
RDW: 12.1 % (ref 11.5–15.5)
WBC: 14.3 10*3/uL — ABNORMAL HIGH (ref 4.0–10.5)
nRBC: 0 % (ref 0.0–0.2)

## 2018-08-29 LAB — COMPREHENSIVE METABOLIC PANEL
ALK PHOS: 66 U/L (ref 38–126)
ALT: 29 U/L (ref 0–44)
ANION GAP: 11 (ref 5–15)
AST: 35 U/L (ref 15–41)
Albumin: 4.5 g/dL (ref 3.5–5.0)
BUN: 7 mg/dL (ref 6–20)
CALCIUM: 9.7 mg/dL (ref 8.9–10.3)
CO2: 24 mmol/L (ref 22–32)
Chloride: 102 mmol/L (ref 98–111)
Creatinine, Ser: 1.24 mg/dL (ref 0.61–1.24)
GFR calc non Af Amer: >60 mL/min (ref >60)
Glucose, Bld: 99 mg/dL (ref 70–99)
POTASSIUM: 3.9 mmol/L (ref 3.5–5.1)
Sodium: 137 mmol/L (ref 135–145)
TOTAL PROTEIN: 7.7 g/dL (ref 6.5–8.1)
Total Bilirubin: 1.3 mg/dL — ABNORMAL HIGH (ref 0.3–1.2)

## 2018-08-29 LAB — LIPASE, BLOOD: Lipase: 29 U/L (ref 11–51)

## 2018-08-29 MED ORDER — ONDANSETRON 4 MG PO TBDP: 4.0000 mg | ORAL_TABLET | Freq: Three times a day (TID) | ORAL | 0 refills | Status: DC | PRN

## 2018-08-29 MED ORDER — HALOPERIDOL LACTATE 5 MG/ML IJ SOLN
5.0000 mg | Freq: Once | INTRAMUSCULAR | Status: AC
  Administered 2018-08-29: 5 mg via INTRAVENOUS
  Filled 2018-08-29: qty 1

## 2018-08-29 MED ORDER — LACTATED RINGERS IV BOLUS
1500.0000 mL | Freq: Once | INTRAVENOUS | Status: AC
  Administered 2018-08-29: 1500 mL via INTRAVENOUS

## 2018-08-29 NOTE — ED Triage Notes (Addendum)
Reports vomiting since yesterday.  States he threw up every hour since then.  Denies diarrhea.  Endorses daily marijuana use but states that this helps his vomiting.

## 2018-08-29 NOTE — ED Provider Notes (Signed)
[facility] EMERGENCY DEPARTMENT Provider Note   CSN: [id number] Arrival date & time: 08/29/18  1334    History   Chief Complaint Chief Complaint  Patient presents with  . Emesis    HPI Michael Adams is a 33 y.o. male.     The history is provided by the patient.  Emesis  Severity:  Severe Duration:  20 hours Timing:  Constant Number of daily episodes:  >15 Quality:  Stomach contents and bilious material (dry heaving) Progression:  Worsening Chronicity:  Recurrent Recent urination:  Decreased Relieved by:  Nothing Worsened by:  Food smell, ice chips and liquids Ineffective treatments:  None tried Associated symptoms: no abdominal pain, no chills, no cough, no diarrhea, no fever, no headaches, no myalgias, no sore throat and no URI   Risk factors: no alcohol use, no diabetes, no prior abdominal surgery, no sick contacts, no suspect food intake and no travel to endemic areas   Risk factors comment:  Does smoke marijuana daily and has had these episodes before   Past Medical History:  Diagnosis Date  . Gastritis     There are no active problems to display for this patient.   History reviewed. No pertinent surgical history.      Home Medications    Prior to Admission medications   Medication Sig Start Date End Date Taking? Authorizing Provider  Calcium Carbonate Antacid (TUMS PO) Take 1 tablet by mouth 5 (five) times daily as needed (acid reflux/heartburn).    [provider]  Dextrose-Fructose-Sod Citrate (NAUZENE PO) Take 2-4 tablets by mouth See admin instructions. Take 2-4 tablets by mouth as needed for nausea/vomiting, may repeat in 15 minutes if still needed. Do not exceed 6 doses in 24 hours.    [provider]  methocarbamol (ROBAXIN) 500 MG tablet Take 1 tablet (500 mg total) by mouth 2 (two) times daily. Patient not taking: Reported on 04/07/2016 08/08/14   Fayrene Helper, PA-C  metoCLOPramide (REGLAN) 10 MG tablet Take  1 tablet (10 mg total) by mouth every 6 (six) hours. Patient not taking: Reported on 12/17/2017 04/07/16   Jaynie Crumble, PA-C  naproxen (NAPROSYN) 500 MG tablet Take 1 tablet (500 mg total) by mouth 2 (two) times daily with a meal. Patient not taking: Reported on 04/07/2016 08/05/14   Muthersbaugh, Dahlia Client, PA-C  naproxen sodium (ALEVE) 220 MG tablet Take 440 mg by mouth 2 (two) times daily as needed (headache).    [provider]  ondansetron (ZOFRAN ODT) 4 MG disintegrating tablet Take 1 tablet (4 mg total) by mouth every 8 (eight) hours as needed for nausea or vomiting. 12/17/17   Bill Salinas, PA-C  oxyCODONE-acetaminophen (PERCOCET) 10-325 MG per tablet Take 1 tablet by mouth every 4 (four) hours as needed for pain. Patient not taking: Reported on 04/07/2016 08/08/14   Fayrene Helper, PA-C  PENICILLIN V POTASSIUM PO Take 1 tablet by mouth once.    [provider]    Family History History reviewed. No pertinent family history.  Social History Social History   Tobacco Use  . Smoking status: Current Every Day Smoker    Packs/day: 1.00    Types: Cigarettes  . Smokeless tobacco: Never Used  Substance Use Topics  . Alcohol use: Yes    Comment: Daily  . Drug use: Yes    Frequency: 7.0 times per week    Types: Marijuana     Allergies   Patient has no known allergies.   Review of  Systems Review of Systems  Constitutional: Negative for chills and fever.  HENT: Negative for sore throat.   Respiratory: Negative for cough, chest tightness and shortness of breath.   Gastrointestinal: Positive for vomiting. Negative for abdominal pain and diarrhea.  Genitourinary: Negative for dysuria, penile pain and testicular pain.  Musculoskeletal: Negative for myalgias.  Neurological: Negative for headaches.  All other systems reviewed and are negative.    Physical Exam Updated Vital Signs BP (!) 153/103 (BP Location: Right Arm)   Pulse 96   Temp 98.7 F (37.1 C)  (Oral)   Resp 16   Ht 5\' 9"  (1.753 m)   Wt 65.8 kg   SpO2 98%   BMI 21.41 kg/m   Physical Exam Vitals signs and nursing note reviewed.  Constitutional:      General: He is not in acute distress.    Appearance: He is well-developed.  HENT:     Head: Normocephalic and atraumatic.     Nose: Nose normal.     Mouth/Throat:     Mouth: Mucous membranes are dry.  Eyes:     Conjunctiva/sclera: Conjunctivae normal.     Pupils: Pupils are equal, round, and reactive to light.  Neck:     Musculoskeletal: Normal range of motion and neck supple.  Cardiovascular:     Rate and Rhythm: Regular rhythm. Tachycardia present.     Heart sounds: No murmur.  Pulmonary:     Effort: Pulmonary effort is normal. No respiratory distress.     Breath sounds: Normal breath sounds. No wheezing or rales.  Abdominal:     General: Bowel sounds are normal. There is no distension.     Palpations: Abdomen is soft.     Tenderness: There is no abdominal tenderness. There is no guarding or rebound.  Musculoskeletal: Normal range of motion.        General: No tenderness.  Skin:    General: Skin is warm and dry.     Findings: No erythema or rash.  Neurological:     General: No focal deficit present.     Mental Status: He is alert and oriented to person, place, and time. Mental status is at baseline.  Psychiatric:        Mood and Affect: Mood normal.        Behavior: Behavior normal.      ED Treatments / Results  Labs (all labs ordered are listed, but only abnormal results are displayed) Labs Reviewed  CBC WITH DIFFERENTIAL/PLATELET - Abnormal; Notable for the following components:      Result Value   WBC 14.3 (*)    Platelets 455 (*)    Neutro Abs 12.8 (*)    All other components within normal limits  COMPREHENSIVE METABOLIC PANEL - Abnormal; Notable for the following components:   Total Bilirubin 1.3 (*)    All other components within normal limits  LIPASE, BLOOD    EKG None  Radiology No  results found.  Procedures Procedures (including critical care time)  Medications Ordered in ED Medications  haloperidol lactate (HALDOL) injection 5 mg (has no administration in time range)  lactated ringers bolus 1,500 mL (has no administration in time range)     Initial Impression / Assessment and Plan / ED Course  I have reviewed the triage vital signs and the nursing notes.  Pertinent labs & imaging results that were available during my care of the patient were reviewed by me and considered in my medical decision making (see chart for  details).       Patient presenting with 20hrs of vomiting.  He has no diarrhea he has no respiratory symptoms at this time.  Patient has no abdominal tenderness on exam.  Low suspicion for appendicitis, perforated viscus, cholecystitis or pancreatitis.  He denies excess alcohol use does not use NSAIDs.  He has had no hematemesis.  This could be cannabinoid hyperemesis syndrome versus viral etiology.  Patient is mildly dehydrated on exam but otherwise well-appearing.  Labs, fluid and Haldol administered.  3:22 PM Labs with leukocytosis of 14 but seems to be chronic based on prior labs.  Cr. wnl and LFT's and lipase wnl.  On repeat exam pt feeling much better and ready to go home.  Final Clinical Impressions(s) / ED Diagnoses   Final diagnoses:  Dehydration  Intractable vomiting with nausea, unspecified vomiting type    ED Discharge Orders         Ordered    ondansetron (ZOFRAN ODT) 4 MG disintegrating tablet  Every 8 hours PRN     08/29/18 1524           Gwyneth Sprout, MD 08/29/18 1524

## 2020-05-07 ENCOUNTER — Other Ambulatory Visit: Payer: Self-pay

## 2020-05-07 ENCOUNTER — Encounter (HOSPITAL_COMMUNITY): Payer: Self-pay | Admitting: Emergency Medicine

## 2020-05-07 ENCOUNTER — Ambulatory Visit (HOSPITAL_COMMUNITY)
Admission: EM | Admit: 2020-05-07 | Discharge: 2020-05-07 | Disposition: A | Discharge status: Home / Self Care | Payer: Self-pay | Attending: Emergency Medicine | Admitting: Emergency Medicine

## 2020-05-07 DIAGNOSIS — H5712 Ocular pain, left eye: Secondary | ICD-10-CM

## 2020-05-07 MED ORDER — FLUORESCEIN SODIUM 1 MG OP STRP
ORAL_STRIP | OPHTHALMIC | Status: AC
  Filled 2020-05-07: qty 1

## 2020-05-07 MED ORDER — DICLOFENAC SODIUM 0.1 % OP SOLN: 1.0000 [drp] | Freq: Four times a day (QID) | OPHTHALMIC | 0 refills | Status: AC

## 2020-05-07 MED ORDER — ERYTHROMYCIN 5 MG/GM OP OINT: TOPICAL_OINTMENT | OPHTHALMIC | 0 refills | Status: DC

## 2020-05-07 NOTE — Discharge Instructions (Signed)
Please use erythromycin antibiotic and anti-inflammatory eyedrops as directed. Please continue to take Tylenol and ibuprofen for pain. If eye pain does not improve, please follow-up with ophthalmology.

## 2020-05-07 NOTE — ED Provider Notes (Signed)
____________________________________________  Time seen: Approximately 10:25 AM  I have reviewed the triage vital signs and the nursing notes.   HISTORY  Chief Complaint Eye Pain   Historian Patient    HPI Michael Adams is a 34 y.o. male presents to the urgent care with left eye pain for the past 3 to 4 days.  Patient was playing with his son and his son accidentally poked his eye while playing.  Patient states he has been able to keep his left eye open but it is difficult.  He has had some increased tearing and photophobia.  No blurry vision.  He has never experienced similar symptoms in the past.  He has been wearing sunglasses but does not wear contact   Past Medical History:  Diagnosis Date  . Gastritis      Immunizations up to date:  Yes.     Past Medical History:  Diagnosis Date  . Gastritis     There are no problems to display for this patient.   History reviewed. No pertinent surgical history.  Prior to Admission medications   Medication Sig Start Date End Date Taking? Authorizing Provider  Calcium Carbonate Antacid (TUMS PO) Take 1 tablet by mouth 5 (five) times daily as needed (acid reflux/heartburn).    [provider]  diclofenac (VOLTAREN) 0.1 % ophthalmic solution Place 1 drop into the left eye 4 (four) times daily for 3 days. 05/07/20 05/10/20  Orvil Feil, PA-C  erythromycin ophthalmic ointment Place a 1/2 inch ribbon of ointment into the lower eyelid. 05/07/20   Orvil Feil, PA-C  methocarbamol (ROBAXIN) 500 MG tablet Take 1 tablet (500 mg total) by mouth 2 (two) times daily. Patient not taking: Reported on 04/07/2016 08/08/14   Fayrene Helper, PA-C  ondansetron (ZOFRAN ODT) 4 MG disintegrating tablet Take 1 tablet (4 mg total) by mouth every 8 (eight) hours as needed for nausea or vomiting. 08/29/18   Gwyneth Sprout, MD  oxyCODONE-acetaminophen (PERCOCET) 10-325 MG per tablet Take 1 tablet by mouth every 4 (four) hours as needed for  pain. Patient not taking: Reported on 04/07/2016 08/08/14   Fayrene Helper, PA-C    Allergies Patient has no known allergies.  History reviewed. No pertinent family history.  Social History Social History   Tobacco Use  . Smoking status: Current Every Day Smoker    Packs/day: 1.00    Types: Cigarettes  . Smokeless tobacco: Never Used  Substance Use Topics  . Alcohol use: Yes    Comment: Daily  . Drug use: Yes    Frequency: 7.0 times per week    Types: Marijuana     Review of Systems  Constitutional: No fever/chills Eyes:  Patient has left eye pain.  ENT: No upper respiratory complaints. Respiratory: no cough. No SOB/ use of accessory muscles to breath Gastrointestinal:   No nausea, no vomiting.  No diarrhea.  No constipation. Musculoskeletal: Negative for musculoskeletal pain. Skin: Negative for rash, abrasions, lacerations, ecchymosis.    ____________________________________________   PHYSICAL EXAM:  VITAL SIGNS: ED Triage Vitals  Enc Vitals Group     BP 05/07/20 0945 119/89     Pulse Rate 05/07/20 0945 83     Resp 05/07/20 0945 16     Temp 05/07/20 0945 98.6 F (37 C)     Temp Source 05/07/20 0945 Oral     SpO2 05/07/20 0945 100 %     Weight --      Height --  Head Circumference --      Peak Flow --      Pain Score 05/07/20 0944 6     Pain Loc --      Pain Edu? --      Excl. in GC? --      Constitutional: Alert and oriented. Well appearing and in no acute distress. Eyes: Patient has scleral injection on the left.  No fluorescein uptake with staining.  Extraocular eye muscles intact.  PERRL. Head: Atraumatic. ENT:      Nose: No congestion/rhinnorhea.      Mouth/Throat: Mucous membranes are moist.  Neck: No stridor.  No cervical spine tenderness to palpation. Cardiovascular: Normal rate, regular rhythm. Normal S1 and S2.  Good peripheral circulation. Respiratory: Normal respiratory effort without tachypnea or retractions. Lungs CTAB. Good air entry  to the bases with no decreased or absent breath sounds Skin:  Skin is warm, dry and intact. No rash noted. Psychiatric: Mood and affect are normal for age. Speech and behavior are normal.   ____________________________________________   LABS (all labs ordered are listed, but only abnormal results are displayed)  Labs Reviewed - No data to display ____________________________________________  EKG   ____________________________________________  RADIOLOGY  No results found.  ____________________________________________    PROCEDURES  Procedure(s) performed:     Procedures     Medications - No data to display   ____________________________________________   INITIAL IMPRESSION / ASSESSMENT AND PLAN / ED COURSE  Pertinent labs & imaging results that were available during my care of the patient were reviewed by me and considered in my medical decision making (see chart for details).      Assessment and plan Eye pain 34 year old male presents to the urgent care with left eye pain for the past 4 days.  Vital signs are reassuring at triage.  On physical exam, extraocular eye muscles were intact patient had no fluorescein uptake with staining.  We will treat patient with topical erythromycin and diclofenac anti-inflammatory drops.  Patient was cautioned that if his symptoms persist, he should follow-up with ophthalmology, Dr. Brooke Dare.  All patient questions were answered.    ____________________________________________  FINAL CLINICAL IMPRESSION(S) / ED DIAGNOSES  Final diagnoses:  Pain of left eye      NEW MEDICATIONS STARTED DURING THIS VISIT:  ED Discharge Orders         Ordered    erythromycin ophthalmic ointment        05/07/20 1021    diclofenac (VOLTAREN) 0.1 % ophthalmic solution  4 times daily        05/07/20 1021              This chart was dictated using voice recognition software/Dragon. Despite best efforts to proofread, errors can  occur which can change the meaning. Any change was purely unintentional.     Orvil Feil, PA-C 05/07/20 1030

## 2020-05-07 NOTE — ED Triage Notes (Signed)
Pt presents with left eye pain xs 4 days. States was playing with son and thinks he may have scratched his eye. States painful with irritation and blurred vision, unable to keep eye open all the way.

## 2023-04-27 ENCOUNTER — Ambulatory Visit (HOSPITAL_COMMUNITY)
Admission: EM | Admit: 2023-04-27 | Discharge: 2023-04-27 | Disposition: A | Discharge status: Home / Self Care | Payer: Self-pay | Attending: Physician Assistant | Admitting: Physician Assistant

## 2023-04-27 ENCOUNTER — Encounter (HOSPITAL_COMMUNITY): Payer: Self-pay

## 2023-04-27 ENCOUNTER — Ambulatory Visit (INDEPENDENT_AMBULATORY_CARE_PROVIDER_SITE_OTHER): Payer: Self-pay

## 2023-04-27 DIAGNOSIS — S62524A Nondisplaced fracture of distal phalanx of right thumb, initial encounter for closed fracture: Secondary | ICD-10-CM

## 2023-04-27 MED ORDER — IBUPROFEN 800 MG PO TABS: 800.0000 mg | ORAL_TABLET | Freq: Three times a day (TID) | ORAL | 0 refills | Status: DC
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
## 2023-04-27 NOTE — ED Triage Notes (Signed)
Pt is here for left thumb pain and swelling after he fell yesterday. No LOC. Pt did not hit his head.

## 2023-04-27 NOTE — Discharge Instructions (Signed)
You have a broken bone in your thumb.  Use the splint for comfort and support.  Take ibuprofen for pain relief.  Do not take NSAIDs with this medication including aspirin, ibuprofen/Advil, naproxen/Aleve.  I would like you to call [facility] to schedule a follow-up appointment to make sure that this is healing appropriately since it is your dominant hand.  Keep it elevated and use ice.  If you have any worsening symptoms including increasing pain, swelling, numbness, tingling you should be seen immediately.

## 2023-09-21 ENCOUNTER — Encounter (HOSPITAL_COMMUNITY): Payer: Self-pay

## 2023-09-21 ENCOUNTER — Ambulatory Visit (INDEPENDENT_AMBULATORY_CARE_PROVIDER_SITE_OTHER): Payer: Self-pay

## 2023-09-21 ENCOUNTER — Ambulatory Visit (HOSPITAL_COMMUNITY)
Admission: EM | Admit: 2023-09-21 | Discharge: 2023-09-21 | Disposition: A | Discharge status: Home / Self Care | Payer: Self-pay | Attending: Sports Medicine | Admitting: Sports Medicine

## 2023-09-21 DIAGNOSIS — R0789 Other chest pain: Secondary | ICD-10-CM

## 2023-09-21 DIAGNOSIS — R030 Elevated blood-pressure reading, without diagnosis of hypertension: Secondary | ICD-10-CM

## 2023-09-21 DIAGNOSIS — S2232XA Fracture of one rib, left side, initial encounter for closed fracture: Secondary | ICD-10-CM

## 2023-09-21 MED ORDER — IBUPROFEN 800 MG PO TABS: 800.0000 mg | ORAL_TABLET | Freq: Three times a day (TID) | ORAL | 0 refills | Status: AC

## 2023-09-21 NOTE — Discharge Instructions (Signed)
 You have a small non-displaced left rib fracture. Rib Fracture Discharge Instructions  Pain Management:  Take Ibuprofen 800mg  and Tylenol 1000mg  up to every 8 hours.   Apply ice packs to the affected area for 20 minutes every 2-3 hours during the first 48 hours to reduce pain and swelling. Respiratory Support:  Perform deep breathing exercises regularly to maintain lung expansion and prevent complications such as atelectasis and pneumonia. Use an incentive spirometer if provided.[3-4]  Cough gently but effectively to clear any mucus from your lungs. Holding a pillow against your chest can help reduce pain while coughing. Activity Modification:  Avoid activities that exacerbate pain or risk further injury. Gradually return to normal activities as pain allows.  Do not lift heavy objects or engage in strenuous activities until pain improves. Warning Signs:  Seek immediate medical attention if you experience difficulty breathing, severe pain not relieved by medication, fever, or signs of infection at the injury site. General Advice:  Maintain a healthy diet and stay hydrated to support the healing process.  Avoid smoking, as it can impair healing and increase the risk of complications

## 2023-09-21 NOTE — ED Triage Notes (Signed)
 Patient states last night after dinner he was walking down the stairs and slipped on his silky pajama pants. Patient states he is having left sided rib pain. Denies LOC or hitting head.

## 2023-09-21 NOTE — ED Provider Notes (Signed)
 [facility]    CSN: [id number] Arrival date & time: 09/21/23  0801      History   Chief Complaint Chief Complaint  Patient presents with   Chest Pain    HPI Michael Adams is a 38 y.o. male here with left sided chest wall pain. He states it started last night after slipping on his stairs and landing on he left side against a step. He had acute onset of pain. Pain with coughing and deep inspiration. Hasn't taken any medication for this or iced it. Denies radiation of pain, N/V, diaphoresis.No head trauma or LOC with the fall.   Chest Pain   Past Medical History:  Diagnosis Date   Gastritis     There are no active problems to display for this patient.   History reviewed. No pertinent surgical history.     Home Medications    Prior to Admission medications   Medication Sig Start Date End Date Taking? Authorizing Provider  ibuprofen (ADVIL) 800 MG tablet Take 1 tablet (800 mg total) by mouth 3 (three) times daily. 09/21/23  Yes Marisa Cyphers, MD    Family History History reviewed. No pertinent family history.  Social History Social History   Tobacco Use   Smoking status: Every Day    Current packs/day: 1.00    Types: Cigarettes   Smokeless tobacco: Never  Substance Use Topics   Alcohol use: Yes    Comment: Daily   Drug use: Yes    Frequency: 7.0 times per week    Types: Marijuana     Allergies   Patient has no known allergies.   Review of Systems Review of Systems  Cardiovascular:  Positive for chest pain.     Physical Exam Triage Vital Signs ED Triage Vitals [09/21/23 0850]  Encounter Vitals Group     BP (!) 153/99     Systolic BP Percentile      Diastolic BP Percentile      Pulse Rate 85     Resp 20     Temp 98 F (36.7 C)     Temp Source Oral     SpO2 98 %     Weight      Height      Head Circumference      Peak Flow      Pain Score 10     Pain Loc      Pain Education      Exclude from Growth Chart    No  data found.  Updated Vital Signs BP (!) 153/99 (BP Location: Left Arm)   Pulse 85   Temp 98 F (36.7 C) (Oral)   Resp 20   SpO2 98%   Physical Exam Vitals reviewed.  Constitutional:      General: He is not in acute distress.    Appearance: He is well-developed and normal weight. He is not ill-appearing, toxic-appearing or diaphoretic.  HENT:     Head: Normocephalic and atraumatic.  Eyes:     Extraocular Movements: Extraocular movements intact.     Pupils: Pupils are equal, round, and reactive to light.  Cardiovascular:     Rate and Rhythm: Normal rate and regular rhythm.     Pulses: Normal pulses.     Heart sounds: Normal heart sounds.     No systolic murmur is present.     No S3 or S4 sounds.  Pulmonary:     Effort: No tachypnea or respiratory distress.  Breath sounds: Normal breath sounds.     Comments: Mild splinting noted Chest:     Chest wall: Tenderness present. No deformity or crepitus.  Abdominal:     General: Bowel sounds are normal.     Palpations: Abdomen is soft.     Tenderness: There is no abdominal tenderness.  Musculoskeletal:        General: Normal range of motion.     Cervical back: Normal range of motion and neck supple.     Right lower leg: No edema.     Left lower leg: No edema.  Skin:    General: Skin is warm.     Capillary Refill: Capillary refill takes less than 2 seconds.     Findings: No ecchymosis, erythema or rash.  Neurological:     General: No focal deficit present.     Mental Status: He is alert and oriented to person, place, and time.  Psychiatric:        Mood and Affect: Mood normal.        Behavior: Behavior normal.      UC Treatments / Results  Labs (all labs ordered are listed, but only abnormal results are displayed) Labs Reviewed - No data to display  EKG   Radiology No results found.  Procedures Procedures (including critical care time)  Medications Ordered in UC Medications - No data to display  Initial  Impression / Assessment and Plan / UC Course  I have reviewed the triage vital signs and the nursing notes.  Pertinent labs & imaging results that were available during my care of the patient were reviewed by me and considered in my medical decision making (see chart for details).    Vitals and triage reviewed, patient is hemodynamically stable.   Acute chest wall pain - Plan: DG Ribs Unilateral W/Chest Left, DG Ribs Unilateral W/Chest Left  Elevated blood pressure reading  Closed fracture of one rib of left side, initial encounter Overall, vitals and exam are reassuring. Blood pressure elevated but review of last 2 urgent care encounters over the past 4 years show blood pressure not in HTN range. No red flags concerning for ACS, flail chest, respiratory distress noted on exam X-rays of the left chest wall reviewed and showed - small non-displaced single rib fracture best seen on the oblique below view. Lungs appear normal. Formal radiologist read pending. Recommended relative rest, icing, ibuprofen, and tylenol for pain. Rx for ibuprofen 800mg  TID x 2 weeks sent. Reviewed deep breathing exercises. Return and ER precautions discussed Patient's questions were answered and they are in agreement with this plan  Final Clinical Impressions(s) / UC Diagnoses   Final diagnoses:  Acute chest wall pain  Elevated blood pressure reading  Closed fracture of one rib of left side, initial encounter     Discharge Instructions      You have a small non-displaced left rib fracture. Rib Fracture Discharge Instructions  Pain Management:  Take Ibuprofen 800mg  and Tylenol 1000mg  up to every 8 hours.   Apply ice packs to the affected area for 20 minutes every 2-3 hours during the first 48 hours to reduce pain and swelling. Respiratory Support:  Perform deep breathing exercises regularly to maintain lung expansion and prevent complications such as atelectasis and pneumonia. Use an incentive  spirometer if provided.[3-4]  Cough gently but effectively to clear any mucus from your lungs. Holding a pillow against your chest can help reduce pain while coughing. Activity Modification:  Avoid activities that exacerbate pain  or risk further injury. Gradually return to normal activities as pain allows.  Do not lift heavy objects or engage in strenuous activities until pain improves. Warning Signs:  Seek immediate medical attention if you experience difficulty breathing, severe pain not relieved by medication, fever, or signs of infection at the injury site. General Advice:  Maintain a healthy diet and stay hydrated to support the healing process.  Avoid smoking, as it can impair healing and increase the risk of complications     ED Prescriptions     Medication Sig Dispense Auth. Provider   ibuprofen (ADVIL) 800 MG tablet Take 1 tablet (800 mg total) by mouth 3 (three) times daily. 42 tablet Kynlea Blackston D, MD      PDMP not reviewed this encounter.   Marliss Simple, MD 09/21/23 [phone]

## 2023-09-23 ENCOUNTER — Telehealth (HOSPITAL_COMMUNITY): Payer: Self-pay | Admitting: Emergency Medicine

## 2023-09-23 NOTE — Telephone Encounter (Signed)
 Pt called requesting additional work note providing restrictions. Per E Raspet, PA okay to provide work note with information provided on AVS. Pt advised if pain worsens to follow-up with Sports [facility] for further restrictions.
